# Patient Record
Sex: Male | Born: 1960 | Race: White | Hispanic: No | Marital: Married | Smoking: Former smoker
Health system: Southern US, Community
[De-identification: ages and names within clinical notes are randomized; demographics above are authoritative.]

## PROBLEM LIST (undated history)

## (undated) DIAGNOSIS — Z87442 Personal history of urinary calculi: Secondary | ICD-10-CM

## (undated) DIAGNOSIS — I10 Essential (primary) hypertension: Secondary | ICD-10-CM

## (undated) HISTORY — DX: Essential (primary) hypertension: I10

---

## 2006-08-13 HISTORY — PX: LUMBAR DISC SURGERY: SHX700

## 2007-03-05 ENCOUNTER — Ambulatory Visit (HOSPITAL_COMMUNITY): Admission: RE | Admit: 2007-03-05 | Discharge: 2007-03-05 | Payer: Self-pay | Admitting: Neurosurgery

## 2007-03-12 ENCOUNTER — Ambulatory Visit (HOSPITAL_COMMUNITY): Admission: RE | Admit: 2007-03-12 | Discharge: 2007-03-14 | Payer: Self-pay | Admitting: Neurosurgery

## 2010-08-24 ENCOUNTER — Emergency Department (HOSPITAL_COMMUNITY)
Admission: EM | Admit: 2010-08-24 | Discharge: 2010-08-24 | Payer: Self-pay | Source: Home / Self Care | Admitting: Emergency Medicine

## 2010-08-28 LAB — URINALYSIS, ROUTINE W REFLEX MICROSCOPIC
Bilirubin Urine: NEGATIVE
Ketones, ur: 15 mg/dL — AB
Leukocytes, UA: NEGATIVE
Nitrite: NEGATIVE
Protein, ur: NEGATIVE mg/dL
Specific Gravity, Urine: 1.017 (ref 1.005–1.030)
Urine Glucose, Fasting: NEGATIVE mg/dL
Urobilinogen, UA: 0.2 mg/dL (ref 0.0–1.0)
pH: 7 (ref 5.0–8.0)

## 2010-08-28 LAB — DIFFERENTIAL
Basophils Absolute: 0.1 10*3/uL (ref 0.0–0.1)
Basophils Relative: 0 % (ref 0–1)
Eosinophils Absolute: 0.1 10*3/uL (ref 0.0–0.7)
Eosinophils Relative: 0 % (ref 0–5)
Lymphocytes Relative: 8 % — ABNORMAL LOW (ref 12–46)
Lymphs Abs: 1.1 10*3/uL (ref 0.7–4.0)
Monocytes Absolute: 0.5 10*3/uL (ref 0.1–1.0)
Monocytes Relative: 4 % (ref 3–12)
Neutro Abs: 11.7 10*3/uL — ABNORMAL HIGH (ref 1.7–7.7)
Neutrophils Relative %: 87 % — ABNORMAL HIGH (ref 43–77)

## 2010-08-28 LAB — CBC
HCT: 50.4 % (ref 39.0–52.0)
Hemoglobin: 18.2 g/dL — ABNORMAL HIGH (ref 13.0–17.0)
MCH: 33 pg (ref 26.0–34.0)
MCHC: 36.1 g/dL — ABNORMAL HIGH (ref 30.0–36.0)
MCV: 91.5 fL (ref 78.0–100.0)
Platelets: 184 10*3/uL (ref 150–400)
RBC: 5.51 MIL/uL (ref 4.22–5.81)
RDW: 12.6 % (ref 11.5–15.5)
WBC: 13.4 10*3/uL — ABNORMAL HIGH (ref 4.0–10.5)

## 2010-08-28 LAB — URINE MICROSCOPIC-ADD ON

## 2010-12-26 NOTE — Op Note (Signed)
Tristan Gray, Tristan Gray               ACCOUNT NO.:  [id number]   MEDICAL RECORD NO.:  [id number]          PATIENT TYPE:  OIB   LOCATION:  5148                         FACILITY:  [facility]   PHYSICIAN:  Hewitt Shorts, M.D.DATE OF BIRTH:  [birth year]   DATE OF PROCEDURE:  03/12/2007  DATE OF DISCHARGE:                               OPERATIVE REPORT   PREOPERATIVE DIAGNOSIS:  Left L4-L5 lumbar disk herniation, lumbar  spondylosis, lumbar degenerative disk disease, and lumbar radiculopathy.   POSTOPERATIVE DIAGNOSIS:  Dynamic degenerative L4-L5 spondylolisthesis,  lumbar stenosis, lumbar spondylosis, and left L4 vertebral body  hemangioma.   PROCEDURE PERFORMED:  Left L4-L5 lumbar laminotomy and microdiskectomy  with microdissection.   SURGEON:  Hewitt Shorts, M.D.   ASSISTANT SURGEON:  Nelia Shi. Webb Silversmith, NP; and Cristi Loron, M.D.   ANESTHESIA:  General endotracheal.   INDICATIONS FOR PROCEDURE:  The patient is a 50 year old man who  presented with a left foot drop.  He was found by MRI scan and re-  confirmed by myelogram and post myelogram CT scan to have a massive L4-  L5 disk herniation that extended rostrally behind the body of L4 with  near complete block at the L4-L5 level.  The decision was made to  proceed with laminotomy and microdiskectomy.   DESCRIPTION OF PROCEDURE:  The patient was brought to the operating room  and placed under general endotracheal anesthesia.  The patient was  turned into the prone position.  The lumbar region was prepped with  Betadine and saline solution and draped in the sterile fashion.  The  midline was infiltrated with the local anesthetic with epinephrine.  An  x-ray was taken and the L4-L5 level identified.  A midline incision was  made over the L4-L5 level and carried down through the subcutaneous  tissue with bipolar cautery, and electrocautery was used to maintain  hemostasis.  Dissection was carried down to the lumbar fascia  which was  incised on the left side and the paraspinal muscles were dissected from  the spinous process and lamina in subperiosteal fashion.  Another x-ray  was taken and the L4-L5 inter laminar space was identified.   The microscope was draped and brought into the field to provide  additional magnification, illumination and visualization.  The remainder  of the decompression was performed using the microdissection and  microsurgical technique.  An inferior L4 and superior L5 laminotomy was  performed.  The L4 laminotomy was extended rostrally to expose the  region behind the L4 vertebral body.  The ligamentum flavum was  carefully removed.  The thecal sac itself was bulging due to the large  size of the disk herniation.  We were able to carefully work our way  around it and began to mobilize what was a very large disk fragment that  came out in several large fragments.  The thecal sac became somewhat  more relaxed.  We did note two small rents in the lateral wall of the  thecal sac above the L5 nerve root.  These were later sutured closed  with interrupted 6-0 Prolene suture.  In the end, we did not enter the  disk space.  The annulus was bulging indicating underlying degeneration,  but no actual rent in the annulus was found, and therefore we solely  removed the free fragment.   After suturing the two small rents in the dura, we established  hemostasis with the use of Gelfoam soaked in thrombin and bipolar  cautery.  Again examined the epidural space to ensure that good  decompression of the thecal sac and exiting nerve roots was achieved,  and then we injected to seal over the dural repair and proceed with  closure.   The deep fascia was closed with interrupted undyed #1 Vicryl sutures;  the Scarpa's fascia was closed with interrupted undyed 0 Vicryl sutures;  the subcutaneous and subcuticular layers were closed with interrupted  inverted 2-0 endo Vicryl sutures; and the skin was  closed with a running  locking 3-0 nylon suture.  The wound was dressed with Adaptic and  sterile gauze.  The procedure was tolerated well.  The estimated blood  loss was 100 mL.  Sponge counts were correct.  Following the procedure,  the patient was to be turned back into the supine position, to be  reversed from the anesthetic, extubated, and transferred to the recovery  room for further care.      Hewitt Shorts, M.D.  Electronically Signed     RWN/MEDQ  D:  03/12/2007  T:  03/13/2007  Job:  [id number]

## 2011-05-28 LAB — BASIC METABOLIC PANEL
BUN: 11
CO2: 28
Calcium: 9.9
Chloride: 105
Creatinine, Ser: 0.81
GFR calc Af Amer: >60
GFR calc non Af Amer: >60
Glucose, Bld: 91
Potassium: 3.9
Sodium: 142

## 2011-05-28 LAB — CBC
HCT: 50.2
Hemoglobin: 17.7 — ABNORMAL HIGH
MCHC: 35.3
MCV: 94.6
Platelets: 223
RBC: 5.3
RDW: 12.3
WBC: 9.6

## 2012-05-14 DIAGNOSIS — I10 Essential (primary) hypertension: Secondary | ICD-10-CM | POA: Diagnosis present

## 2017-01-14 DIAGNOSIS — F411 Generalized anxiety disorder: Secondary | ICD-10-CM | POA: Diagnosis present

## 2017-01-17 DIAGNOSIS — E559 Vitamin D deficiency, unspecified: Secondary | ICD-10-CM | POA: Insufficient documentation

## 2017-05-23 DIAGNOSIS — R748 Abnormal levels of other serum enzymes: Secondary | ICD-10-CM | POA: Diagnosis present

## 2019-12-14 ENCOUNTER — Other Ambulatory Visit: Payer: Self-pay | Admitting: Family Medicine

## 2019-12-14 DIAGNOSIS — M5116 Intervertebral disc disorders with radiculopathy, lumbar region: Secondary | ICD-10-CM

## 2020-01-01 ENCOUNTER — Other Ambulatory Visit: Payer: Self-pay | Admitting: Neurosurgery

## 2020-01-01 DIAGNOSIS — M4316 Spondylolisthesis, lumbar region: Secondary | ICD-10-CM

## 2020-01-04 ENCOUNTER — Encounter: Payer: Self-pay | Admitting: Physical Therapy

## 2020-01-04 ENCOUNTER — Ambulatory Visit
Admission: RE | Admit: 2020-01-04 | Discharge: 2020-01-04 | Disposition: A | Discharge status: Home / Self Care | Payer: 59 | Source: Ambulatory Visit | Attending: Neurosurgery | Admitting: Neurosurgery

## 2020-01-04 ENCOUNTER — Other Ambulatory Visit: Payer: Self-pay

## 2020-01-04 ENCOUNTER — Ambulatory Visit: Payer: No Typology Code available for payment source | Attending: Neurosurgery | Admitting: Physical Therapy

## 2020-01-04 DIAGNOSIS — M6281 Muscle weakness (generalized): Secondary | ICD-10-CM | POA: Diagnosis present

## 2020-01-04 DIAGNOSIS — M5441 Lumbago with sciatica, right side: Secondary | ICD-10-CM

## 2020-01-04 DIAGNOSIS — R262 Difficulty in walking, not elsewhere classified: Secondary | ICD-10-CM | POA: Diagnosis present

## 2020-01-04 DIAGNOSIS — M4316 Spondylolisthesis, lumbar region: Secondary | ICD-10-CM

## 2020-01-04 NOTE — Patient Instructions (Signed)
Access Code: [email or web address] URL: [email or web address] Date: 01/04/2020 Prepared by: Lysle Rubens  Exercises Supine Lower Trunk Rotation - 1 x daily - 7 x weekly - 3 sets - 10 reps - 5 sec hold Supine Posterior Pelvic Tilt - 1 x daily - 7 x weekly - 3 sets - 10 reps - 3 sec hold Supine Double Knee to Chest - 1 x daily - 7 x weekly - 3 sets - 10 reps - 5 sec hold

## 2020-01-04 NOTE — Therapy (Signed)
[facility] [address] [place], [place], [address] Phone: [phone]   Fax:  [phone]  Physical Therapy Evaluation  Patient Details  Name: Tristan Gray MRN: [id number] Date of Birth: [birth year] Referring Provider (PT): Hoyt Koch   Encounter Date: 01/04/2020  PT End of Session - 01/04/20 1744    Visit Number  1    Date for PT Re-Evaluation  03/05/20    PT Start Time  1700    PT Stop Time  1748    PT Time Calculation (min)  48 min    Activity Tolerance  Patient limited by pain    Behavior During Therapy  [facility] for tasks assessed/performed       Past Medical History:  Diagnosis Date  . Hypertension     Past Surgical History:  Procedure Laterality Date  . LUMBAR DISC SURGERY  2008    There were no vitals filed for this visit.   Subjective Assessment - 01/04/20 1704    Subjective  Pt reports L4-L5 sciatica on R side beginning on 12/04/2019. Pt states that he noticed that his back was a little sore before but woke up one morning and his back was really hurting plus radiating pain. Pt reports radiating pain down RLE similar to what he experienced prior to back surgery in 2008. Pt reports tingling on R toes and numbness on outside of RLE. Pt reports that course of steroids last week helped with inflammation. Pt reports negative for bowel and bladder changes.    Pertinent History  lumbar surgery 2008, HTN    How long can you sit comfortably?  <5 minutes; leans heavily to the L    How long can you stand comfortably?  <5 min    How long can you walk comfortably?  <5 min    Diagnostic tests  MRI    Patient Stated Goals  not be in pain    Currently in Pain?  Yes    Pain Score  7     Pain Location  Back    Pain Orientation  Right    Pain Descriptors / Indicators  Radiating;Burning;Aching    Pain Radiating Towards  R LE    Pain Onset  More than a month ago    Pain Frequency  Constant    Aggravating Factors    walking, bending, sitting, standing    Pain Relieving Factors  finding comfortable position, steroids, pain meds         [facility] PT Assessment - 01/04/20 0001      Assessment   Medical Diagnosis  LBP with radiating pain     Referring Provider (PT)  Hoyt Koch    Onset Date/Surgical Date  12/04/19    Prior Therapy  None      Precautions   Precautions  None      Restrictions   Weight Bearing Restrictions  No      Balance Screen   Has the patient fallen in the past 6 months  Yes    How many times?  1    Has the patient had a decrease in activity level because of a fear of falling?   No    Is the patient reluctant to leave their home because of a fear of falling?   No      Home Environment   Additional Comments  Trouble with stairs; 4 stairs to enter house and needs assistance from wife  Prior Function   Level of Independence  Independent      Sensation   Light Touch  Impaired by gross assessment    Additional Comments  diminished sensation along lateral aspect of lower RLE      Functional Tests   Functional tests  Sit to Stand      Sit to Stand   Comments  leaning excessively on LLE      Posture/Postural Control   Posture/Postural Control  Postural limitations    Postural Limitations  Flexed trunk;Weight shift left      ROM / Strength   AROM / PROM / Strength  Strength;AROM      AROM   Overall AROM Comments  Lumbar AROM severely limited in all directions d/t pain      Strength   Strength Assessment Site  Hip;Knee;Ankle    Right/Left Hip  Right;Left    Right Hip Flexion  5/5    Right Hip Extension  3/5    Right Hip ABduction  3-/5    Left Hip Flexion  5/5    Left Hip Extension  4/5    Left Hip ABduction  4+/5    Right/Left Knee  Right;Left    Right Knee Flexion  4/5    Right Knee Extension  4/5    Left Knee Flexion  5/5    Left Knee Extension  5/5    Right/Left Ankle  Right;Left    Right Ankle Dorsiflexion  2-/5    Left Ankle Dorsiflexion  5/5       Flexibility   Soft Tissue Assessment /Muscle Length  yes    Hamstrings  unable to test secondary to pain    Piriformis  unable to test secondary to pain      Palpation   Palpation comment  tender to palpation R lumbar paraspinals, R TFL, R glute/piriformis      Special Tests    Special Tests  Lumbar    Lumbar Tests  Straight Leg Raise      Straight Leg Raise   Findings  Positive    Side   Right      Transfers   Comments  very painful transferring from supine<>sit      Ambulation/Gait   Ambulation/Gait  Yes    Ambulation/Gait Assistance  6: Modified independent (Device/Increase time)    Ambulation Distance (Feet)  25 Feet    Assistive device  Straight cane    Gait Pattern  Decreased arm swing - right;Decreased step length - left;Decreased stance time - right;Decreased stride length;Decreased hip/knee flexion - right;Decreased dorsiflexion - right;Decreased weight shift to right;Antalgic;Poor foot clearance - right    Ambulation Surface  Level    Gait velocity  diminished    Gait Comments  noticable foot drop on R                  Objective measurements completed on examination: See above findings.      [facility] Adult PT Treatment/Exercise - 01/04/20 0001      Exercises   Exercises  Lumbar      Lumbar Exercises: Stretches   Double Knee to Chest Stretch  5 reps;10 seconds    Lower Trunk Rotation  60 seconds    Pelvic Tilt  10 reps;5 seconds    Pelvic Tilt Limitations  supine      Modalities   Modalities  Electrical Stimulation;Moist Heat      Moist Heat Therapy   Number Minutes Moist Heat  12  Minutes    Moist Heat Location  Lumbar Spine      Electrical Stimulation   Electrical Stimulation Location  Lumbar    Electrical Stimulation Action  IFC    Electrical Stimulation Parameters  hooklying    Electrical Stimulation Goals  Pain             PT Education - 01/04/20 1743    Education Details  Pt educated on condition, rehab process, and HEP     Person(s) Educated  Patient    Methods  Explanation;Demonstration;Handout    Comprehension  Verbalized understanding;Returned demonstration       PT Short Term Goals - 01/04/20 1759      PT SHORT TERM GOAL #1   Title  Pt will be independent with HEP    Time  2    Period  Weeks    Status  New    Target Date  01/18/20      PT SHORT TERM GOAL #2   Title  Pt will demonstrate R DF MMT of 3+/5    Baseline  2-/5    Time  3    Period  Weeks    Status  New    Target Date  01/25/20        PT Long Term Goals - 01/04/20 1759      PT LONG TERM GOAL #1   Title  Pt will demonstrate lumbar AROM WFL without reports of increase in LBP/radiating pain    Time  8    Period  Weeks    Status  New    Target Date  02/29/20      PT LONG TERM GOAL #2   Title  Pt will report ability to sit >30 minutes without increase in LBP or radiating pain    Time  8    Period  Weeks    Status  New    Target Date  02/29/20      PT LONG TERM GOAL #3   Title  Pt will report resolution of radiating pain in RLE    Time  8    Period  Weeks    Status  New    Target Date  02/29/20      PT LONG TERM GOAL #4   Title  Pt will demonstrate RLE MMT equivalent to LLE    Time  8    Period  Weeks    Target Date  02/29/20      PT LONG TERM GOAL #5   Title  Pt will report reduction in pain by 50%    Time  8    Period  Weeks    Status  New    Target Date  02/29/20             Plan - 01/04/20 1744    Clinical Impression Statement  Pt presents to clinic with 1 month history of acute onset of R LBP with radiating pain into RLE. Pt has history of L4-L5 laminectomy/discetomy in 2008; pt reports pain is similar to pain he experienced prior to surgery. In clinic, pt demonstrates significant foot drop on RLE with significant deficits in R DF strength. Pt demonstrates diminished sensation along lateral aspect of RLE, diminished strength on R, positive SLR on R, lumbar ROM deficits secondary to pain, and pain  with all transitional movements. Pt ambulated with SPC; demonstrates antalgic gait with excessive leaning to LLE. Pt has been unable to work or perform ADLs d/t pain. Pt  would benefit from skilled PT to address the above impairments.    Personal Factors and Comorbidities  Comorbidity 1;Past/Current Experience    Comorbidities  HTN    Examination-Participation Restrictions  Cleaning;Community Activity;Driving;Interpersonal Relationship;Dorita Sciara    Stability/Clinical Decision Making  Evolving/Moderate complexity    Clinical Decision Making  Moderate    Rehab Potential  Good    PT Frequency  2x / week    PT Duration  8 weeks    PT Treatment/Interventions  ADLs/Self Care Home Management;Cryotherapy;Electrical Stimulation;Ultrasound;Traction;Moist Heat;Iontophoresis 4mg /ml Dexamethasone;Gait training;Stair training;Functional mobility training;Therapeutic activities;Therapeutic exercise;Balance training;Neuromuscular re-education;Manual techniques;Patient/family education;Passive range of motion;Dry needling;Taping    PT Next Visit Plan  Review HEP, get pt moving, pain reduction, lumbar/LE strengthening/flexibility    PT Home Exercise Plan  lower trunk rotations, double knee to chest, PPT in hooklying    Consulted and Agree with Plan of Care  Patient       Patient will benefit from skilled therapeutic intervention in order to improve the following deficits and impairments:  Abnormal gait, Decreased range of motion, Difficulty walking, Decreased endurance, Increased muscle spasms, Decreased activity tolerance, Pain, Decreased balance, Hypomobility, Impaired flexibility, Improper body mechanics, Decreased mobility, Decreased strength, Impaired sensation, Postural dysfunction  Visit Diagnosis: Muscle weakness (generalized)  Acute right-sided low back pain with right-sided sciatica  Difficulty in walking, not elsewhere classified     Problem List There are no problems to display for this  patient.  Amador Cunas, PT, DPT Donald Prose Caleigha Zale 01/04/2020, 6:06 PM  [place] [address] [place], [place], [address] Phone: [phone]   Fax:  [phone]  Name: JIOVANNY BURDELL MRN: [id number] Date of Birth: [birth year]

## 2020-01-07 ENCOUNTER — Other Ambulatory Visit: Payer: Self-pay

## 2020-01-07 ENCOUNTER — Ambulatory Visit: Payer: No Typology Code available for payment source | Admitting: Physical Therapy

## 2020-01-07 DIAGNOSIS — M5441 Lumbago with sciatica, right side: Secondary | ICD-10-CM

## 2020-01-07 DIAGNOSIS — M6281 Muscle weakness (generalized): Secondary | ICD-10-CM | POA: Diagnosis not present

## 2020-01-07 DIAGNOSIS — R262 Difficulty in walking, not elsewhere classified: Secondary | ICD-10-CM

## 2020-01-07 NOTE — Therapy (Signed)
[facility] [address] [place], [place], [address] Phone: [phone]   Fax:  [phone]  Physical Therapy Treatment  Patient Details  Name: Tristan Gray MRN: [id number] Date of Birth: 05/18/1961 Referring Provider (PT): Duffy Rhody   Encounter Date: 01/07/2020  PT End of Session - 01/07/20 1707    Visit Number  2    Date for PT Re-Evaluation  03/05/20    PT Start Time  1615    PT Stop Time  1703    PT Time Calculation (min)  48 min       Past Medical History:  Diagnosis Date  . Hypertension     Past Surgical History:  Procedure Laterality Date  . [place] SURGERY  2008    There were no vitals filed for this visit.  Subjective Assessment - 01/07/20 1619    Subjective  " some better, I think preniside helped." pt amb with SPC decreased wt on RT LE and with sitting shifts Left.    Pain Score  7     Pain Location  Back    Pain Orientation  Right                        [facility] Adult PT Treatment/Exercise - 01/07/20 0001      Lumbar Exercises: Supine   Ab Set  15 reps;3 seconds   with ball   Clam  15 reps;3 seconds   tband   Bent Knee Raise  10 reps;3 seconds   isometric into ball   Bridge with Ball Squeeze  15 reps;3 seconds    Other Supine Lumbar Exercises  bridge, KTC and obl with feet on ball 10 reps    Other Supine Lumbar Exercises  DF 10x with reisstance, added tabnd but needed cues as he kicked in additional muscles      Modalities   Modalities  Traction      Traction   Type of Traction  Lumbar    Max (lbs)  50    Time  15      Manual Therapy   Manual Therapy  Passive ROM    Manual therapy comments  strtech and pain but pt felt helpfull    Passive ROM  RT LE HS with calf adn neural flossing             PT Education - 01/07/20 1649    Education Details  pt able ot verb doing HEP. pt stated he has inversion table but fearful to try- okayed to use but  limited ROM    Person(s) Educated  Patient    Methods  Explanation;Demonstration    Comprehension  Verbalized understanding       PT Short Term Goals - 01/07/20 1650      PT SHORT TERM GOAL #1   Title  Pt will be independent with HEP    Status  Partially Met      PT SHORT TERM GOAL #2   Title  Pt will demonstrate R DF MMT of 3+/5    Status  On-going        PT Long Term Goals - 01/04/20 1759      PT LONG TERM GOAL #1   Title  Pt will demonstrate lumbar AROM WFL without reports of increase in LBP/radiating pain    Time  8    Period  Weeks    Status  New  Target Date  02/29/20      PT LONG TERM GOAL #2   Title  Pt will report ability to sit >30 minutes without increase in LBP or radiating pain    Time  8    Period  Weeks    Status  New    Target Date  02/29/20      PT LONG TERM GOAL #3   Title  Pt will report resolution of radiating pain in RLE    Time  8    Period  Weeks    Status  New    Target Date  02/29/20      PT LONG TERM GOAL #4   Title  Pt will demonstrate RLE MMT equivalent to LLE    Time  8    Period  Weeks    Target Date  02/29/20      PT LONG TERM GOAL #5   Title  Pt will report reduction in pain by 50%    Time  8    Period  Weeks    Status  New    Target Date  02/29/20            Plan - 01/07/20 1650    Clinical Impression Statement  pt tolerated initial progression of core stab well but did need cuing to decrease compensation. pt very tight in RT HS esp with added calf stretch and nerve flossing- but felt it helped, showed pt how to do at home with strap. mech lumb traction pt verb felt okay no increase in symptoms and up taller after- okayed inversion table gentle at home.DF is still limited.    PT Treatment/Interventions  ADLs/Self Care Home Management;Cryotherapy;Electrical Stimulation;Ultrasound;Traction;Moist Heat;Iontophoresis 63m/ml Dexamethasone;Gait training;Stair training;Functional mobility training;Therapeutic  activities;Therapeutic exercise;Balance training;Neuromuscular re-education;Manual techniques;Patient/family education;Passive range of motion;Dry needling;Taping    PT Next Visit Plan  assess and progress       Patient will benefit from skilled therapeutic intervention in order to improve the following deficits and impairments:  Abnormal gait, Decreased range of motion, Difficulty walking, Decreased endurance, Increased muscle spasms, Decreased activity tolerance, Pain, Decreased balance, Hypomobility, Impaired flexibility, Improper body mechanics, Decreased mobility, Decreased strength, Impaired sensation, Postural dysfunction  Visit Diagnosis: Muscle weakness (generalized)  Acute right-sided low back pain with right-sided sciatica  Difficulty in walking, not elsewhere classified     Problem List There are no problems to display for this patient.   Tristan Gray,ANGIE PTA 01/07/2020, 5:08 PM  [address] [address]Phone: [phone]  Fax:  [phone] Name: Tristan ROSOLMRN: [id number] of Birth: 804/11/1960

## 2020-01-13 ENCOUNTER — Ambulatory Visit: Payer: No Typology Code available for payment source | Attending: Neurosurgery | Admitting: Physical Therapy

## 2020-01-13 ENCOUNTER — Other Ambulatory Visit: Payer: Self-pay

## 2020-01-13 ENCOUNTER — Encounter: Payer: Self-pay | Admitting: Physical Therapy

## 2020-01-13 DIAGNOSIS — M6281 Muscle weakness (generalized): Secondary | ICD-10-CM | POA: Diagnosis present

## 2020-01-13 DIAGNOSIS — M5441 Lumbago with sciatica, right side: Secondary | ICD-10-CM | POA: Insufficient documentation

## 2020-01-13 DIAGNOSIS — R262 Difficulty in walking, not elsewhere classified: Secondary | ICD-10-CM | POA: Diagnosis present

## 2020-01-13 NOTE — Therapy (Signed)
[facility] [address] [place], [place], [address] Phone: [phone]   Fax:  [phone]  Physical Therapy Treatment  Patient Details  Name: Tristan Gray MRN: [id number] Date of Birth: 08-08-1961 Referring Provider (PT): Hoyt Koch   Encounter Date: 01/13/2020  PT End of Session - 01/13/20 1415    Visit Number  3    Date for PT Re-Evaluation  03/05/20    PT Start Time  1345    PT Stop Time  1428    PT Time Calculation (min)  43 min    Activity Tolerance  Patient limited by pain    Behavior During Therapy  [facility] for tasks assessed/performed       Past Medical History:  Diagnosis Date  . Hypertension     Past Surgical History:  Procedure Laterality Date  . LUMBAR DISC SURGERY  2008    There were no vitals filed for this visit.  Subjective Assessment - 01/13/20 1349    Subjective  "More mobility less pain since last session"    Currently in Pain?  Yes    Pain Score  6     Pain Location  Back                        [facility] Adult PT Treatment/Exercise - 01/13/20 0001      Lumbar Exercises: Aerobic   Nustep  L3 x 3 min      Lumbar Exercises: Supine   Ab Set  15 reps;3 seconds   with ball    Clam  15 reps;3 seconds    Bridge  Compliant;15 reps    Bridge with [employer]  15 reps;3 seconds    Other Supine Lumbar Exercises  bridge, KTC and obl with feet on ball 10 reps      Traction   Type of Traction  Lumbar    Min (lbs)  55    Max (lbs)  65    Hold Time  60    Rest Time  20    Time  12      Manual Therapy   Passive ROM  RT LE HS with calf                PT Short Term Goals - 01/13/20 1415      PT SHORT TERM GOAL #1   Title  Pt will be independent with HEP    Status  Achieved        PT Long Term Goals - 01/04/20 1759      PT LONG TERM GOAL #1   Title  Pt will demonstrate lumbar AROM WFL without reports of increase in LBP/radiating pain    Time  8    Period  Weeks    Status  New    Target Date  02/29/20      PT LONG TERM GOAL #2   Title  Pt will report ability to sit >30 minutes without increase in LBP or radiating pain    Time  8    Period  Weeks    Status  New    Target Date  02/29/20      PT LONG TERM GOAL #3   Title  Pt will report resolution of radiating pain in RLE    Time  8    Period  Weeks    Status  New    Target Date  02/29/20  PT LONG TERM GOAL #4   Title  Pt will demonstrate RLE MMT equivalent to LLE    Time  8    Period  Weeks    Target Date  02/29/20      PT LONG TERM GOAL #5   Title  Pt will report reduction in pain by 50%    Time  8    Period  Weeks    Status  New    Target Date  02/29/20            Plan - 01/13/20 1416    Clinical Impression Statement  Pt limited by pain today despite reporting improvement overall. Un able to tolerated sitting or standing interventions Initial pain in R leg with supine bridges while legs on [place] but was able to perform this after some K2C reps. Constant tactile and verbal cues needed to maintain core engagement with the supine interventions. Pt reported that he would like more pull with mechanical traction.    Personal Factors and Comorbidities  Comorbidity 1;Past/Current Experience    Comorbidities  HTN    Examination-Participation Restrictions  Cleaning;Community Activity;Driving;Interpersonal Relationship;Dorita Sciara    Stability/Clinical Decision Making  Evolving/Moderate complexity    Rehab Potential  Good    PT Frequency  2x / week    PT Duration  8 weeks    PT Treatment/Interventions  ADLs/Self Care Home Management;Cryotherapy;Electrical Stimulation;Ultrasound;Traction;Moist Heat;Iontophoresis 4mg /ml Dexamethasone;Gait training;Stair training;Functional mobility training;Therapeutic activities;Therapeutic exercise;Balance training;Neuromuscular re-education;Manual techniques;Patient/family education;Passive range of motion;Dry needling;Taping     PT Next Visit Plan  assess and progress       Patient will benefit from skilled therapeutic intervention in order to improve the following deficits and impairments:  Abnormal gait, Decreased range of motion, Difficulty walking, Decreased endurance, Increased muscle spasms, Decreased activity tolerance, Pain, Decreased balance, Hypomobility, Impaired flexibility, Improper body mechanics, Decreased mobility, Decreased strength, Impaired sensation, Postural dysfunction  Visit Diagnosis: Acute right-sided low back pain with right-sided sciatica  Difficulty in walking, not elsewhere classified  Muscle weakness (generalized)     Problem List There are no problems to display for this patient.   Scot Jun, PTA 01/13/2020, 2:23 PM  [address] [place], [place], [address] Phone: [phone]   Fax:  [phone]  Name: ELIZAR ALPERN MRN: [id number] Date of Birth: 01-05-1961

## 2020-01-14 ENCOUNTER — Ambulatory Visit: Payer: No Typology Code available for payment source | Admitting: Physical Therapy

## 2020-01-14 DIAGNOSIS — M5441 Lumbago with sciatica, right side: Secondary | ICD-10-CM

## 2020-01-14 DIAGNOSIS — M6281 Muscle weakness (generalized): Secondary | ICD-10-CM

## 2020-01-14 DIAGNOSIS — R262 Difficulty in walking, not elsewhere classified: Secondary | ICD-10-CM

## 2020-01-14 NOTE — Therapy (Signed)
[place] [address] [place], [place], [address] Phone: [phone]   Fax:  [phone]  Physical Therapy Treatment  Patient Details  Name: Tristan Gray MRN: [id number] Date of Birth: [birth year] Referring Provider (PT): Tristan Gray   Encounter Date: 01/14/2020  PT End of Session - 01/14/20 1643    Visit Number  4    Date for PT Re-Evaluation  03/05/20    PT Start Time  1610    PT Stop Time  1700    PT Time Calculation (min)  50 min       Past Medical History:  Diagnosis Date  . Hypertension     Past Surgical History:  Procedure Laterality Date  . [place] SURGERY  2008    There were no vitals filed for this visit.  Subjective Assessment - 01/14/20 1621    Subjective  amb in without AD, antalgic with RT foot drop but reports improvemnet. pt verb traction helps afraid to use inversion d/t leg and table locks at ankles    Currently in Pain?  Yes    Pain Score  6     Pain Location  Back    Pain Orientation  Right                        [facility] Adult PT Treatment/Exercise - 01/14/20 0001      Lumbar Exercises: Aerobic   Elliptical  2 min fwd/2 backward    UBE (Upper Arm Bike)  L 3 2 min fwd/ 2 back   standing with multi rest breaks.     Lumbar Exercises: Standing   Row  Strengthening;Both;15 reps;Theraband    Theraband Level (Row)  Level 2 (Red)    Shoulder Extension  Strengthening;Both;15 reps;Theraband    Theraband Level (Shoulder Extension)  Level 2 (Red)    Other Standing Lumbar Exercises  hip ext and abd BIL 10 each      Lumbar Exercises: Seated   Long Arc Quad on Chair  Strengthening;Both;10 reps   on sit fit   Sit to Stand  10 reps   wt ball    Other Seated Lumbar Exercises  sit fit pelvic ROM 10 each way      Traction   Type of Traction  Lumbar    Min (lbs)  60    Max (lbs)  70    Hold Time  60    Rest Time  20    Time  15      Manual Therapy   Passive  ROM  RT LE HS with calf                PT Short Term Goals - 01/14/20 1643      PT SHORT TERM GOAL #2   Title  Pt will demonstrate R DF MMT of 3+/5    Status  On-going        PT Long Term Goals - 01/04/20 1759      PT LONG TERM GOAL #1   Title  Pt will demonstrate lumbar AROM WFL without reports of increase in LBP/radiating pain    Time  8    Period  Weeks    Status  New    Target Date  02/29/20      PT LONG TERM GOAL #2   Title  Pt will report ability to sit >30 minutes without increase in LBP or radiating pain  Time  8    Period  Weeks    Status  New    Target Date  02/29/20      PT LONG TERM GOAL #3   Title  Pt will report resolution of radiating pain in RLE    Time  8    Period  Weeks    Status  New    Target Date  02/29/20      PT LONG TERM GOAL #4   Title  Pt will demonstrate RLE MMT equivalent to LLE    Time  8    Period  Weeks    Target Date  02/29/20      PT LONG TERM GOAL #5   Title  Pt will report reduction in pain by 50%    Time  8    Period  Weeks    Status  New    Target Date  02/29/20            Plan - 01/14/20 1644    Clinical Impression Statement  better flexibilty in RT LE. increased ex today but needed freq seated rest with standing ex d/t increased radiating symptoms. cuing for ex to stab and control mvmt. pt continues to have RT foot drop but is now amb without AD. pt is complaint with ex at home and cautioned to not overdo and he VU. pt states relief with traction and asked to increase.    PT Treatment/Interventions  ADLs/Self Care Home Management;Cryotherapy;Electrical Stimulation;Ultrasound;Traction;Moist Heat;Iontophoresis 4mg /ml Dexamethasone;Gait training;Stair training;Functional mobility training;Therapeutic activities;Therapeutic exercise;Balance training;Neuromuscular re-education;Manual techniques;Patient/family education;Passive range of motion;Dry needling;Taping    PT Next Visit Plan  assess and progress        Patient will benefit from skilled therapeutic intervention in order to improve the following deficits and impairments:  Abnormal gait, Decreased range of motion, Difficulty walking, Decreased endurance, Increased muscle spasms, Decreased activity tolerance, Pain, Decreased balance, Hypomobility, Impaired flexibility, Improper body mechanics, Decreased mobility, Decreased strength, Impaired sensation, Postural dysfunction  Visit Diagnosis: Difficulty in walking, not elsewhere classified  Acute right-sided low back pain with right-sided sciatica  Muscle weakness (generalized)     Problem List There are no problems to display for this patient.   Tristan Gray,Tristan Gray 01/14/2020, 4:47 PM  [facility] [address] [place], [place], [place] Phone: [phone]   Fax:  [phone]  Name: Tristan Gray MRN: Tristan Gray Date of Birth: 10/15/1960

## 2020-01-19 ENCOUNTER — Encounter: Payer: Self-pay | Admitting: Physical Therapy

## 2020-01-19 ENCOUNTER — Ambulatory Visit: Payer: No Typology Code available for payment source | Admitting: Physical Therapy

## 2020-01-19 ENCOUNTER — Other Ambulatory Visit: Payer: Self-pay

## 2020-01-19 DIAGNOSIS — M5441 Lumbago with sciatica, right side: Secondary | ICD-10-CM | POA: Diagnosis not present

## 2020-01-19 DIAGNOSIS — M6281 Muscle weakness (generalized): Secondary | ICD-10-CM

## 2020-01-19 DIAGNOSIS — R262 Difficulty in walking, not elsewhere classified: Secondary | ICD-10-CM

## 2020-01-19 NOTE — Therapy (Signed)
[facility] [address] [place], [place], [address] Phone: [phone]   Fax:  [phone]  Physical Therapy Treatment  Patient Details  Name: Tristan Gray MRN: [id number] Date of Birth: 11-Apr-1961 Referring Provider (PT): Hoyt Koch   Encounter Date: 01/19/2020  PT End of Session - 01/19/20 1441    Visit Number  5    Date for PT Re-Evaluation  03/05/20    PT Start Time  1400    PT Stop Time  1450    PT Time Calculation (min)  50 min    Activity Tolerance  Patient tolerated treatment well    Behavior During Therapy  [facility] for tasks assessed/performed       Past Medical History:  Diagnosis Date  . Hypertension     Past Surgical History:  Procedure Laterality Date  . LUMBAR DISC SURGERY  2008    There were no vitals filed for this visit.  Subjective Assessment - 01/19/20 1407    Subjective  Pt reports that he feels he is getting better; says traction is helping. States he is waiting for a call about his CT scan results.    Currently in Pain?  Yes    Pain Score  5     Pain Location  Back    Pain Orientation  Right                        [facility] Adult PT Treatment/Exercise - 01/19/20 0001      Lumbar Exercises: Stretches   Active Hamstring Stretch  Right;1 rep;60 seconds    Piriformis Stretch  Right;1 rep;60 seconds      Lumbar Exercises: Aerobic   Nustep  L5 x 7 min LE only      Lumbar Exercises: Machines for Strengthening   Other Lumbar Machine Exercise  lats and rows 45# 2x15      Lumbar Exercises: Standing   Other Standing Lumbar Exercises  hip ext and abd BIL 10 each    Other Standing Lumbar Exercises  sit to stand with weighted ball press and overhead raise 2x10      Lumbar Exercises: Seated   Long Arc Quad on Chair  Strengthening;Both;10 reps    Other Seated Lumbar Exercises  seated medicine ball twist x10 B      Traction   Type of Traction  Lumbar    Min (lbs)  60     Max (lbs)  70    Hold Time  60    Rest Time  20    Time  15               PT Short Term Goals - 01/14/20 1643      PT SHORT TERM GOAL #2   Title  Pt will demonstrate R DF MMT of 3+/5    Status  On-going        PT Long Term Goals - 01/04/20 1759      PT LONG TERM GOAL #1   Title  Pt will demonstrate lumbar AROM WFL without reports of increase in LBP/radiating pain    Time  8    Period  Weeks    Status  New    Target Date  02/29/20      PT LONG TERM GOAL #2   Title  Pt will report ability to sit >30 minutes without increase in LBP or radiating pain    Time  8  Period  Weeks    Status  New    Target Date  02/29/20      PT LONG TERM GOAL #3   Title  Pt will report resolution of radiating pain in RLE    Time  8    Period  Weeks    Status  New    Target Date  02/29/20      PT LONG TERM GOAL #4   Title  Pt will demonstrate RLE MMT equivalent to LLE    Time  8    Period  Weeks    Target Date  02/29/20      PT LONG TERM GOAL #5   Title  Pt will report reduction in pain by 50%    Time  8    Period  Weeks    Status  New    Target Date  02/29/20            Plan - 01/19/20 1441    Clinical Impression Statement  Pt tolerated progression of TE. Pt continues to have R foot drop but is able to better tolerate standing ex's this rx. Some complaints of radiating pain with lumbar extension; resolved with seated lumbar flexion stretches. Pt reports relief with traction.    PT Treatment/Interventions  ADLs/Self Care Home Management;Cryotherapy;Electrical Stimulation;Ultrasound;Traction;Moist Heat;Iontophoresis 4mg /ml Dexamethasone;Gait training;Stair training;Functional mobility training;Therapeutic activities;Therapeutic exercise;Balance training;Neuromuscular re-education;Manual techniques;Patient/family education;Passive range of motion;Dry needling;Taping    PT Next Visit Plan  assess and progress    Consulted and Agree with Plan of Care  Patient        Patient will benefit from skilled therapeutic intervention in order to improve the following deficits and impairments:  Abnormal gait, Decreased range of motion, Difficulty walking, Decreased endurance, Increased muscle spasms, Decreased activity tolerance, Pain, Decreased balance, Hypomobility, Impaired flexibility, Improper body mechanics, Decreased mobility, Decreased strength, Impaired sensation, Postural dysfunction  Visit Diagnosis: Difficulty in walking, not elsewhere classified  Acute right-sided low back pain with right-sided sciatica  Muscle weakness (generalized)     Problem List There are no problems to display for this patient.  Amador Cunas, PT, DPT Donald Prose Edgar Corrigan 01/19/2020, 2:43 PM  [place] [address] [place], [place], [address] Phone: [phone]   Fax:  [phone]  Name: Tristan Gray MRN: [id number] Date of Birth: 1961/02/26

## 2020-01-20 ENCOUNTER — Other Ambulatory Visit: Payer: 59

## 2020-01-21 ENCOUNTER — Ambulatory Visit: Payer: No Typology Code available for payment source | Admitting: Physical Therapy

## 2020-01-21 ENCOUNTER — Other Ambulatory Visit: Payer: Self-pay

## 2020-01-21 DIAGNOSIS — M5441 Lumbago with sciatica, right side: Secondary | ICD-10-CM

## 2020-01-21 DIAGNOSIS — M6281 Muscle weakness (generalized): Secondary | ICD-10-CM

## 2020-01-21 DIAGNOSIS — R262 Difficulty in walking, not elsewhere classified: Secondary | ICD-10-CM

## 2020-01-21 NOTE — Therapy (Signed)
[address] [place], [place], [address] Phone: [phone]   Fax:  [phone]  Physical Therapy Treatment  Patient Details  Name: Tristan Gray MRN: [id number] Date of Birth: 07/19/1961 Referring Provider (PT): Duffy Rhody   Encounter Date: 01/21/2020   PT End of Session - 01/21/20 1554    Visit Number 6    Date for PT Re-Evaluation 03/05/20    PT Start Time 1525    PT Stop Time 1615    PT Time Calculation (min) 50 min           Past Medical History:  Diagnosis Date  . Hypertension     Past Surgical History:  Procedure Laterality Date  . [place] SURGERY  2008    There were no vitals filed for this visit.   Subjective Assessment - 01/21/20 1526    Subjective doing better, everything is helping    Currently in Pain? Yes    Pain Score 4     Pain Location Back                             [facility] Adult PT Treatment/Exercise - 01/21/20 0001      Lumbar Exercises: Aerobic   Recumbent Bike 6 min L 2      Lumbar Exercises: Standing   Other Standing Lumbar Exercises resisitred gait 40# fwd/back /SW    Other Standing Lumbar Exercises cable ext and row      Lumbar Exercises: Supine   Other Supine Lumbar Exercises wt ball core stab      Lumbar Exercises: Quadruped   Opposite Arm/Leg Raise 20 reps;Left arm/Right leg;Right arm/Left leg      Traction   Type of Traction Lumbar    Min (lbs) 60    Max (lbs) 70    Hold Time 60    Rest Time 20    Time 15      Manual Therapy   Manual Therapy Passive ROM    Passive ROM RT LE HS with calf                     PT Short Term Goals - 01/21/20 1554      PT SHORT TERM GOAL #2   Title Pt will demonstrate R DF MMT of 3+/5    Baseline 3/5    Status Partially Met             PT Long Term Goals - 01/21/20 1555      PT LONG TERM GOAL #1   Title Pt will demonstrate lumbar AROM WFL without reports of  increase in LBP/radiating pain    Status On-going      PT LONG TERM GOAL #2   Title Pt will report ability to sit >30 minutes without increase in LBP or radiating pain    Status On-going      PT LONG TERM GOAL #3   Title Pt will report resolution of radiating pain in RLE    Status On-going      PT LONG TERM GOAL #4   Title Pt will demonstrate RLE MMT equivalent to LLE    Status On-going      PT LONG TERM GOAL #5   Title Pt will report reduction in pain by 50%    Status Achieved  Plan - 01/21/20 1555    Clinical Impression Statement progressing with LTGS. pain less, func better and improved gait but still impaired with RT LE weakness and foot drop.pt tolerated all ther ex without rest but some paina nd wekaness with cuing needed for core activation. increased PROM and continued relief with traction    PT Treatment/Interventions ADLs/Self Care Home Management;Cryotherapy;Electrical Stimulation;Ultrasound;Traction;Moist Heat;Iontophoresis 50m/ml Dexamethasone;Gait training;Stair training;Functional mobility training;Therapeutic activities;Therapeutic exercise;Balance training;Neuromuscular re-education;Manual techniques;Patient/family education;Passive range of motion;Dry needling;Taping    PT Next Visit Plan progress           Patient will benefit from skilled therapeutic intervention in order to improve the following deficits and impairments:  Abnormal gait, Decreased range of motion, Difficulty walking, Decreased endurance, Increased muscle spasms, Decreased activity tolerance, Pain, Decreased balance, Hypomobility, Impaired flexibility, Improper body mechanics, Decreased mobility, Decreased strength, Impaired sensation, Postural dysfunction  Visit Diagnosis: Difficulty in walking, not elsewhere classified  Acute right-sided low back pain with right-sided sciatica  Muscle weakness (generalized)     Problem List There are no problems to display for this  patient.   Kaylem Gidney,ANGIE PTA 01/21/2020, 3:58 PM  [address] [address]Phone: [phone]  Fax:  [phone] Name: SFAUSTINO LUECKEMRN: [id number] of Birth: 8December 22, 1962

## 2020-01-26 ENCOUNTER — Ambulatory Visit: Payer: No Typology Code available for payment source | Admitting: Physical Therapy

## 2020-01-28 ENCOUNTER — Ambulatory Visit: Payer: No Typology Code available for payment source | Admitting: Physical Therapy

## 2020-01-28 ENCOUNTER — Encounter: Payer: Self-pay | Admitting: Physical Therapy

## 2020-01-28 ENCOUNTER — Other Ambulatory Visit: Payer: Self-pay

## 2020-01-28 DIAGNOSIS — M6281 Muscle weakness (generalized): Secondary | ICD-10-CM

## 2020-01-28 DIAGNOSIS — R262 Difficulty in walking, not elsewhere classified: Secondary | ICD-10-CM

## 2020-01-28 DIAGNOSIS — M5441 Lumbago with sciatica, right side: Secondary | ICD-10-CM

## 2020-01-28 NOTE — Therapy (Signed)
[place] [address] [address] [place], [place], [address] Phone: [phone]   Fax:  [phone]  Physical Therapy Treatment  Patient Details  Name: Tristan Gray MRN: [id number] Date of Birth: July 01, 1961 Referring Provider (PT): Duffy Rhody   Encounter Date: 01/28/2020   PT End of Session - 01/28/20 1655    Visit Number 7    Date for PT Re-Evaluation 03/05/20    PT Start Time 1615    PT Stop Time 1710    PT Time Calculation (min) 55 min    Activity Tolerance Patient tolerated treatment well    Behavior During Therapy [facility] for tasks assessed/performed           Past Medical History:  Diagnosis Date  . Hypertension     Past Surgical History:  Procedure Laterality Date  . [place] SURGERY  2008    There were no vitals filed for this visit.   Subjective Assessment - 01/28/20 1624    Subjective Pt reports he had massage this weekend and is feeling much better    Currently in Pain? Yes    Pain Score 3     Pain Location Back    Pain Orientation Right                             [facility] Adult PT Treatment/Exercise - 01/28/20 0001      Lumbar Exercises: Aerobic   UBE (Upper Arm Bike) constant work 5 min      Lumbar Exercises: Machines for Strengthening   Cybex Lumbar Extension complained of pinching sensation so d/c this rx    Leg Press 40# BLE 2x10, 20# RLE 1x10    Other Lumbar Machine Exercise lats and rows 45# 2x15      Lumbar Exercises: Standing   Other Standing Lumbar Exercises standing ext over exercise ball x10 with 5 sec hold; heel raises BLE x15 SLS x15      Lumbar Exercises: Seated   Other Seated Lumbar Exercises seated lumbar flexion fwd/lat x2 each direction with 5 sec hold      Traction   Type of Traction Lumbar    Min (lbs) 60    Max (lbs) 70    Hold Time 60    Rest Time 20    Time 15                    PT Short Term Goals - 01/21/20 1554      PT  SHORT TERM GOAL #2   Title Pt will demonstrate R DF MMT of 3+/5    Baseline 3/5    Status Partially Met             PT Long Term Goals - 01/21/20 1555      PT LONG TERM GOAL #1   Title Pt will demonstrate lumbar AROM WFL without reports of increase in LBP/radiating pain    Status On-going      PT LONG TERM GOAL #2   Title Pt will report ability to sit >30 minutes without increase in LBP or radiating pain    Status On-going      PT LONG TERM GOAL #3   Title Pt will report resolution of radiating pain in RLE    Status On-going      PT LONG TERM GOAL #4   Title Pt will demonstrate RLE MMT equivalent to LLE  Status On-going      PT LONG TERM GOAL #5   Title Pt will report reduction in pain by 50%    Status Achieved                 Plan - 01/28/20 1655    Clinical Impression Statement Pt presents to clinic with gait much improved from last rx; reports that along with PT, massage therapist this past weekend helped a lot. Pt still demonstrates R foot drop with trace contractions in ant tib. Pt tolerated progression of TE well; lumbar ext with black TB increased LBP so d/c that ex this rx. Pt reports relief with traction.    PT Treatment/Interventions ADLs/Self Care Home Management;Cryotherapy;Electrical Stimulation;Ultrasound;Traction;Moist Heat;Iontophoresis 79m/ml Dexamethasone;Gait training;Stair training;Functional mobility training;Therapeutic activities;Therapeutic exercise;Balance training;Neuromuscular re-education;Manual techniques;Patient/family education;Passive range of motion;Dry needling;Taping    PT Next Visit Plan progress lumbar exs and core stab. traction as indicated    Consulted and Agree with Plan of Care Patient           Patient will benefit from skilled therapeutic intervention in order to improve the following deficits and impairments:  Abnormal gait, Decreased range of motion, Difficulty walking, Decreased endurance, Increased muscle spasms,  Decreased activity tolerance, Pain, Decreased balance, Hypomobility, Impaired flexibility, Improper body mechanics, Decreased mobility, Decreased strength, Impaired sensation, Postural dysfunction  Visit Diagnosis: Difficulty in walking, not elsewhere classified  Acute right-sided low back pain with right-sided sciatica  Muscle weakness (generalized)     Problem List There are no problems to display for this patient.  AAmador Cunas PT, DPT ADonald ProseSugg 01/28/2020, 4:59 PM  [address] [address] [address]Phone: [phone]  Fax:  [phone] Name: SYOLANDA DOCKENDORFMRN: [id number] of Birth: [birth year]

## 2020-02-01 ENCOUNTER — Ambulatory Visit: Payer: No Typology Code available for payment source | Admitting: Physical Therapy

## 2020-02-02 ENCOUNTER — Ambulatory Visit: Payer: No Typology Code available for payment source | Admitting: Physical Therapy

## 2020-02-02 ENCOUNTER — Encounter: Payer: Self-pay | Admitting: Physical Therapy

## 2020-02-02 ENCOUNTER — Other Ambulatory Visit: Payer: Self-pay

## 2020-02-02 DIAGNOSIS — M6281 Muscle weakness (generalized): Secondary | ICD-10-CM

## 2020-02-02 DIAGNOSIS — R262 Difficulty in walking, not elsewhere classified: Secondary | ICD-10-CM

## 2020-02-02 DIAGNOSIS — M5441 Lumbago with sciatica, right side: Secondary | ICD-10-CM | POA: Diagnosis not present

## 2020-02-02 NOTE — Therapy (Signed)
[address] [place], [place], [address] Phone: [phone]   Fax:  [phone]  Physical Therapy Treatment  Patient Details  Name: Tristan Gray MRN: [id number] Date of Birth: 1960/11/20 Referring Provider (PT): Duffy Rhody   Encounter Date: 02/02/2020   PT End of Session - 02/02/20 1415    Visit Number 8    Date for PT Re-Evaluation 03/05/20    PT Start Time 1345    PT Stop Time 1430    PT Time Calculation (min) 45 min    Activity Tolerance Patient tolerated treatment well    Behavior During Therapy [facility] for tasks assessed/performed           Past Medical History:  Diagnosis Date  . Hypertension     Past Surgical History:  Procedure Laterality Date  . [place] SURGERY  2008    There were no vitals filed for this visit.   Subjective Assessment - 02/02/20 1346    Subjective Bad day today, back is a little sore, its going down the leg, I must have slept wrong    Currently in Pain? Yes    Pain Score 6     Pain Location Back                             [facility] Adult PT Treatment/Exercise - 02/02/20 0001      Lumbar Exercises: Aerobic   UBE (Upper Arm Bike) constant work  5 min      Lumbar Exercises: Machines for Strengthening   Leg Press 40# BLE 2x10, 40lb heel raises 2x15    Other Lumbar Machine Exercise lats and rows 45# 3x15      Lumbar Exercises: Standing   Other Standing Lumbar Exercises Shoulder Ext 10lb 2x10     Other Standing Lumbar Exercises forward step ups 6in x10 each, Lateral 6in step ups x 10 each      Traction   Type of Traction Lumbar    Min (lbs) 65    Max (lbs) 75    Hold Time 60    Rest Time 20    Time 12                    PT Short Term Goals - 01/21/20 1554      PT SHORT TERM GOAL #2   Title Pt will demonstrate R DF MMT of 3+/5    Baseline 3/5    Status Partially Met             PT Long Term Goals - 02/02/20 1416       PT LONG TERM GOAL #1   Title Pt will demonstrate lumbar AROM WFL without reports of increase in LBP/radiating pain    Status On-going                 Plan - 02/02/20 1416    Clinical Impression Statement Despite soreness pt able to complete all of today's interventions. Avoided leg press contralateral machine strengthening to keep pressures on low back equal. Pt appears to have some balance instability with step up interventions. Additional reps tolerated with seated rows and lats without issue.    Personal Factors and Comorbidities Comorbidity 1;Past/Current Experience    Comorbidities HTN    Examination-Participation Restrictions Cleaning;Community Activity;Driving;Interpersonal Relationship;[facility]    Stability/Clinical Decision Making Evolving/Moderate complexity    Rehab Potential Good  PT Frequency 2x / week    PT Duration 8 weeks    PT Treatment/Interventions ADLs/Self Care Home Management;Cryotherapy;Electrical Stimulation;Ultrasound;Traction;Moist Heat;Iontophoresis 72m/ml Dexamethasone;Gait training;Stair training;Functional mobility training;Therapeutic activities;Therapeutic exercise;Balance training;Neuromuscular re-education;Manual techniques;Patient/family education;Passive range of motion;Dry needling;Taping    PT Next Visit Plan progress lumbar exs and core stab. traction as indicated           Patient will benefit from skilled therapeutic intervention in order to improve the following deficits and impairments:  Abnormal gait, Decreased range of motion, Difficulty walking, Decreased endurance, Increased muscle spasms, Decreased activity tolerance, Pain, Decreased balance, Hypomobility, Impaired flexibility, Improper body mechanics, Decreased mobility, Decreased strength, Impaired sensation, Postural dysfunction  Visit Diagnosis: Acute right-sided low back pain with right-sided sciatica  Muscle weakness (generalized)  Difficulty in walking, not  elsewhere classified     Problem List There are no problems to display for this patient.   RScot Jun PTA 02/02/2020, 2:20 PM  [address] [address]Phone: [phone]  Fax:  [phone] Name: Tristan BAILMRN: [id number] of Birth: 811-19-1962

## 2020-02-09 ENCOUNTER — Ambulatory Visit: Payer: No Typology Code available for payment source | Admitting: Physical Therapy

## 2020-02-10 ENCOUNTER — Encounter: Payer: Self-pay | Admitting: Physical Therapy

## 2020-02-10 ENCOUNTER — Ambulatory Visit: Payer: No Typology Code available for payment source | Admitting: Physical Therapy

## 2020-02-10 ENCOUNTER — Other Ambulatory Visit: Payer: Self-pay

## 2020-02-10 DIAGNOSIS — M5441 Lumbago with sciatica, right side: Secondary | ICD-10-CM | POA: Diagnosis not present

## 2020-02-10 DIAGNOSIS — R262 Difficulty in walking, not elsewhere classified: Secondary | ICD-10-CM

## 2020-02-10 DIAGNOSIS — M6281 Muscle weakness (generalized): Secondary | ICD-10-CM

## 2020-02-10 NOTE — Therapy (Signed)
[address] [place], [place], [address] Phone: [phone]   Fax:  [phone]  Physical Therapy Treatment  Patient Details  Name: Tristan Gray MRN: [id number] Date of Birth: Mar 26, 1961 Referring Provider (PT): Duffy Rhody   Encounter Date: 02/10/2020   PT End of Session - 02/10/20 1135    Visit Number 9    Date for PT Re-Evaluation 03/05/20    PT Start Time 1100    PT Stop Time 1147    PT Time Calculation (min) 47 min    Activity Tolerance Patient tolerated treatment well    Behavior During Therapy [facility] for tasks assessed/performed           Past Medical History:  Diagnosis Date  . Hypertension     Past Surgical History:  Procedure Laterality Date  . [place] SURGERY  2008    There were no vitals filed for this visit.   Subjective Assessment - 02/10/20 1102    Subjective Pt reports that he is doing better overall. Is beginning to feel a deep ache in front of R shin down to R toe; pain is waking him up at night and seems to be unaffected by change in position.    Currently in Pain? Yes    Pain Score 3     Pain Location Leg    Pain Orientation Right                             [facility] Adult PT Treatment/Exercise - 02/10/20 0001      Lumbar Exercises: Aerobic   Recumbent Bike 7 min      Lumbar Exercises: Machines for Strengthening   Other Lumbar Machine Exercise lats and rows 45# 3x15    Other Lumbar Machine Exercise standing shoulder ext 15# 3x15, AR press 25# 2x15 B      Lumbar Exercises: Standing   Heel Raises 15 reps    Heel Raises Limitations 3 sets x15    Other Standing Lumbar Exercises resisted gait 50# x4 each direction      Traction   Type of Traction Lumbar    Min (lbs) 65    Max (lbs) 75    Hold Time 60    Rest Time 20    Time 12                    PT Short Term Goals - 01/21/20 1554      PT SHORT TERM GOAL #2   Title Pt will  demonstrate R DF MMT of 3+/5    Baseline 3/5    Status Partially Met             PT Long Term Goals - 02/02/20 1416      PT LONG TERM GOAL #1   Title Pt will demonstrate lumbar AROM WFL without reports of increase in LBP/radiating pain    Status On-going                 Plan - 02/10/20 1135    Clinical Impression Statement Pt tolerated progression of TE well with no complaints of increased LBP. Pt still has difficulty tolerating extension based ex's with reports of pinching sensation in LB. Pt reported new aching pain waking him up in the night recently; pt also stated he would like to discuss CT scan with MD. Encouraged pt to make follow up appt with  MD to discuss concerns with pt verbalized understanding/agreement. Pt reports relief with traction.    PT Treatment/Interventions ADLs/Self Care Home Management;Cryotherapy;Electrical Stimulation;Ultrasound;Traction;Moist Heat;Iontophoresis 65m/ml Dexamethasone;Gait training;Stair training;Functional mobility training;Therapeutic activities;Therapeutic exercise;Balance training;Neuromuscular re-education;Manual techniques;Patient/family education;Passive range of motion;Dry needling;Taping    PT Next Visit Plan progress lumbar exs and core stab. traction as indicated    Consulted and Agree with Plan of Care Patient           Patient will benefit from skilled therapeutic intervention in order to improve the following deficits and impairments:  Abnormal gait, Decreased range of motion, Difficulty walking, Decreased endurance, Increased muscle spasms, Decreased activity tolerance, Pain, Decreased balance, Hypomobility, Impaired flexibility, Improper body mechanics, Decreased mobility, Decreased strength, Impaired sensation, Postural dysfunction  Visit Diagnosis: Acute right-sided low back pain with right-sided sciatica  Muscle weakness (generalized)  Difficulty in walking, not elsewhere classified     Problem List There are  no problems to display for this patient.  AAmador Cunas PT, DPT ADonald ProseSugg 02/10/2020, 11:38 AM  [address] [address]Phone: [phone]  Fax:  [phone] Name: Tristan HAGGARMRN: [id number] of Birth: 8May 28, 1962

## 2020-02-16 ENCOUNTER — Ambulatory Visit: Payer: No Typology Code available for payment source | Attending: Neurosurgery | Admitting: Physical Therapy

## 2020-02-18 ENCOUNTER — Ambulatory Visit: Payer: No Typology Code available for payment source | Admitting: Physical Therapy

## 2020-02-23 ENCOUNTER — Ambulatory Visit: Payer: No Typology Code available for payment source | Admitting: Physical Therapy

## 2020-03-13 ENCOUNTER — Emergency Department (HOSPITAL_BASED_OUTPATIENT_CLINIC_OR_DEPARTMENT_OTHER)
Admission: EM | Admit: 2020-03-13 | Discharge: 2020-03-13 | Disposition: A | Discharge status: Home / Self Care | Payer: No Typology Code available for payment source | Attending: Emergency Medicine | Admitting: Emergency Medicine

## 2020-03-13 ENCOUNTER — Emergency Department (HOSPITAL_BASED_OUTPATIENT_CLINIC_OR_DEPARTMENT_OTHER): Payer: No Typology Code available for payment source

## 2020-03-13 ENCOUNTER — Other Ambulatory Visit: Payer: Self-pay

## 2020-03-13 ENCOUNTER — Encounter (HOSPITAL_BASED_OUTPATIENT_CLINIC_OR_DEPARTMENT_OTHER): Payer: Self-pay | Admitting: Emergency Medicine

## 2020-03-13 DIAGNOSIS — M47819 Spondylosis without myelopathy or radiculopathy, site unspecified: Secondary | ICD-10-CM

## 2020-03-13 DIAGNOSIS — S161XXA Strain of muscle, fascia and tendon at neck level, initial encounter: Secondary | ICD-10-CM | POA: Diagnosis present

## 2020-03-13 DIAGNOSIS — S39012A Strain of muscle, fascia and tendon of lower back, initial encounter: Secondary | ICD-10-CM | POA: Diagnosis not present

## 2020-03-13 DIAGNOSIS — Y939 Activity, unspecified: Secondary | ICD-10-CM | POA: Diagnosis not present

## 2020-03-13 DIAGNOSIS — Y92009 Unspecified place in unspecified non-institutional (private) residence as the place of occurrence of the external cause: Secondary | ICD-10-CM | POA: Diagnosis not present

## 2020-03-13 DIAGNOSIS — M479 Spondylosis, unspecified: Secondary | ICD-10-CM | POA: Insufficient documentation

## 2020-03-13 DIAGNOSIS — Y998 Other external cause status: Secondary | ICD-10-CM | POA: Diagnosis not present

## 2020-03-13 DIAGNOSIS — I10 Essential (primary) hypertension: Secondary | ICD-10-CM | POA: Diagnosis not present

## 2020-03-13 MED ORDER — CYCLOBENZAPRINE HCL 10 MG PO TABS: 5.0000 mg | ORAL_TABLET | Freq: Two times a day (BID) | ORAL | 0 refills | Status: DC | PRN

## 2020-03-13 MED ORDER — CELECOXIB 200 MG PO CAPS: 200.0000 mg | ORAL_CAPSULE | Freq: Two times a day (BID) | ORAL | 0 refills | Status: DC
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
## 2020-03-29 ENCOUNTER — Other Ambulatory Visit: Payer: Self-pay | Admitting: Neurosurgery

## 2020-04-05 NOTE — Progress Notes (Signed)
[id number] [place], [place] - [address] [place] [address] Phone: [phone] Fax: [phone]      Your procedure is scheduled on August 31  Report to [facility] Main Entrance "A" at 0530 A.M., and check in at the Admitting office.  Call this number if you have problems the morning of surgery:  [phone]  Call [phone] if you have any questions prior to your surgery date Monday-Friday 8am-4pm    Remember:  Do not eat  Or drink after midnight the night before your surgery     Take these medicines the morning of surgery with A SIP OF WATER  buPROPion (WELLBUTRIN XL)  FLUoxetine (PROZAC)  gabapentin (NEURONTIN) HYDROcodone-acetaminophen (NORCO/VICODIN) if needed methocarbamol (ROBAXIN)  If needed   As of today, STOP taking any Aspirin (unless otherwise instructed by your surgeon) Aleve, Naproxen, Ibuprofen, Motrin, Advil, Goody's, BC's, all herbal medications, fish oil, and all vitamins. meloxicam (MOBIC)                      Do not wear jewelry            Do not wear lotions, powders, colognes, or deodorant.            Men may shave face and neck.            Do not bring valuables to the hospital.            [facility] is not responsible for any belongings or valuables.  Do NOT Smoke (Tobacco/Vaping) or drink Alcohol 24 hours prior to your procedure If you use a CPAP at night, you may bring all equipment for your overnight stay.   Contacts, glasses, dentures or bridgework may not be worn into surgery.      For patients admitted to the hospital, discharge time will be determined by your treatment team.   Patients discharged the day of surgery will not be allowed to drive home, and someone needs to stay with them for 24 hours.    Special instructions:   [place]- Preparing For Surgery  Before surgery, you can play an important role. Because skin is not sterile, your skin needs to be as free of germs as possible.  You can reduce the number of germs on your skin by washing with CHG (chlorahexidine gluconate) Soap before surgery.  CHG is an antiseptic cleaner which kills germs and bonds with the skin to continue killing germs even after washing.    Oral Hygiene is also important to reduce your risk of infection.  Remember - BRUSH YOUR TEETH THE MORNING OF SURGERY WITH YOUR REGULAR TOOTHPASTE  Please do not use if you have an allergy to CHG or antibacterial soaps. If your skin becomes reddened/irritated stop using the CHG.  Do not shave (including legs and underarms) for at least 48 hours prior to first CHG shower. It is OK to shave your face.  Please follow these instructions carefully.   1. Shower the NIGHT BEFORE SURGERY and the MORNING OF SURGERY with CHG Soap.   2. If you chose to wash your hair, wash your hair first as usual with your normal shampoo.  3. After you shampoo, rinse your hair and body thoroughly to remove the shampoo.  4. Use CHG as you would any other liquid soap. You can apply CHG directly to the skin and wash gently with a scrungie or a clean washcloth.   5. Apply the CHG Soap to  your body ONLY FROM THE NECK DOWN.  Do not use on open wounds or open sores. Avoid contact with your eyes, ears, mouth and genitals (private parts). Wash Face and genitals (private parts)  with your normal soap.   6. Wash thoroughly, paying special attention to the area where your surgery will be performed.  7. Thoroughly rinse your body with warm water from the neck down.  8. DO NOT shower/wash with your normal soap after using and rinsing off the CHG Soap.  9. Pat yourself dry with a CLEAN TOWEL.  10. Wear CLEAN PAJAMAS to bed the night before surgery  11. Place CLEAN SHEETS on your bed the night of your first shower and DO NOT SLEEP WITH PETS.   Day of Surgery: Wear Clean/Comfortable clothing the morning of surgery Do not apply any deodorants/lotions.   Remember to brush your teeth WITH YOUR  REGULAR TOOTHPASTE.   Please read over the following fact sheets that you were given.

## 2020-04-06 ENCOUNTER — Encounter (HOSPITAL_COMMUNITY): Payer: Self-pay

## 2020-04-06 ENCOUNTER — Other Ambulatory Visit: Payer: Self-pay

## 2020-04-06 ENCOUNTER — Encounter (HOSPITAL_COMMUNITY)
Admission: RE | Admit: 2020-04-06 | Discharge: 2020-04-06 | Disposition: A | Discharge status: Home / Self Care | Payer: No Typology Code available for payment source | Source: Ambulatory Visit | Attending: Neurosurgery | Admitting: Neurosurgery

## 2020-04-06 DIAGNOSIS — R9431 Abnormal electrocardiogram [ECG] [EKG]: Secondary | ICD-10-CM | POA: Insufficient documentation

## 2020-04-06 DIAGNOSIS — Z01812 Encounter for preprocedural laboratory examination: Secondary | ICD-10-CM | POA: Insufficient documentation

## 2020-04-06 DIAGNOSIS — Z01810 Encounter for preprocedural cardiovascular examination: Secondary | ICD-10-CM | POA: Insufficient documentation

## 2020-04-06 HISTORY — DX: Personal history of urinary calculi: Z87.442

## 2020-04-06 LAB — CBC
HCT: 48.9 % (ref 39.0–52.0)
Hemoglobin: 16.6 g/dL (ref 13.0–17.0)
MCH: 32.7 pg (ref 26.0–34.0)
MCHC: 33.9 g/dL (ref 30.0–36.0)
MCV: 96.3 fL (ref 80.0–100.0)
Platelets: 179 10*3/uL (ref 150–400)
RBC: 5.08 MIL/uL (ref 4.22–5.81)
RDW: 12.7 % (ref 11.5–15.5)
WBC: 6.1 10*3/uL (ref 4.0–10.5)
nRBC: 0 % (ref 0.0–0.2)

## 2020-04-06 LAB — BASIC METABOLIC PANEL
Anion gap: 10 (ref 5–15)
BUN: 10 mg/dL (ref 6–20)
CO2: 20 mmol/L — ABNORMAL LOW (ref 22–32)
Calcium: 9.1 mg/dL (ref 8.9–10.3)
Chloride: 109 mmol/L (ref 98–111)
Creatinine, Ser: 0.76 mg/dL (ref 0.61–1.24)
GFR calc Af Amer: >60 mL/min (ref >60)
GFR calc non Af Amer: >60 mL/min (ref >60)
Glucose, Bld: 90 mg/dL (ref 70–99)
Potassium: 4 mmol/L (ref 3.5–5.1)
Sodium: 139 mmol/L (ref 135–145)

## 2020-04-06 LAB — SURGICAL PCR SCREEN
MRSA, PCR: NEGATIVE
Staphylococcus aureus: NEGATIVE

## 2020-04-06 LAB — TYPE AND SCREEN
ABO/RH(D): O POS
Antibody Screen: NEGATIVE

## 2020-04-06 NOTE — Progress Notes (Signed)
PCP:  Kip C Cardiologist:  Denies  EKG:  04/06/20 CXR:  N/A ECHO:  N/A Stress Test:  Denies Cardiac Cath:  Denies  Covid test 04/08/20  Patient denies shortness of breath, fever, cough, and chest pain at PAT appointment.  Patient verbalized understanding of instructions provided today at the PAT appointment.  Patient asked to review instructions at home and day of surgery.

## 2020-04-09 ENCOUNTER — Other Ambulatory Visit (HOSPITAL_COMMUNITY): Payer: No Typology Code available for payment source

## 2020-04-09 ENCOUNTER — Other Ambulatory Visit (HOSPITAL_COMMUNITY)
Admission: RE | Admit: 2020-04-09 | Discharge: 2020-04-09 | Disposition: A | Discharge status: Home / Self Care | Payer: No Typology Code available for payment source | Source: Ambulatory Visit | Attending: Neurosurgery | Admitting: Neurosurgery

## 2020-04-09 DIAGNOSIS — Z01812 Encounter for preprocedural laboratory examination: Secondary | ICD-10-CM | POA: Insufficient documentation

## 2020-04-09 DIAGNOSIS — Z20822 Contact with and (suspected) exposure to covid-19: Secondary | ICD-10-CM | POA: Insufficient documentation

## 2020-04-09 LAB — SARS CORONAVIRUS 2 (TAT 6-24 HRS): SARS Coronavirus 2: NEGATIVE

## 2020-04-12 ENCOUNTER — Inpatient Hospital Stay (HOSPITAL_COMMUNITY): Payer: No Typology Code available for payment source

## 2020-04-12 ENCOUNTER — Inpatient Hospital Stay (HOSPITAL_COMMUNITY): Payer: No Typology Code available for payment source | Admitting: Certified Registered Nurse Anesthetist

## 2020-04-12 ENCOUNTER — Other Ambulatory Visit: Payer: Self-pay

## 2020-04-12 ENCOUNTER — Inpatient Hospital Stay (HOSPITAL_COMMUNITY)
Admission: RE | Admit: 2020-04-12 | Discharge: 2020-04-13 | DRG: 455 | Disposition: A | Discharge status: Home / Self Care | Payer: No Typology Code available for payment source | Attending: Neurosurgery | Admitting: Neurosurgery

## 2020-04-12 ENCOUNTER — Encounter (HOSPITAL_COMMUNITY)
Admission: RE | Disposition: A | Discharge status: Home / Self Care | Payer: Self-pay | Source: Home / Self Care | Attending: Neurosurgery

## 2020-04-12 ENCOUNTER — Encounter (HOSPITAL_COMMUNITY): Payer: Self-pay

## 2020-04-12 DIAGNOSIS — Z20822 Contact with and (suspected) exposure to covid-19: Secondary | ICD-10-CM | POA: Diagnosis present

## 2020-04-12 DIAGNOSIS — M4316 Spondylolisthesis, lumbar region: Secondary | ICD-10-CM | POA: Diagnosis present

## 2020-04-12 DIAGNOSIS — I10 Essential (primary) hypertension: Secondary | ICD-10-CM | POA: Diagnosis present

## 2020-04-12 DIAGNOSIS — Z79899 Other long term (current) drug therapy: Secondary | ICD-10-CM

## 2020-04-12 DIAGNOSIS — Z87442 Personal history of urinary calculi: Secondary | ICD-10-CM | POA: Diagnosis not present

## 2020-04-12 DIAGNOSIS — M48061 Spinal stenosis, lumbar region without neurogenic claudication: Secondary | ICD-10-CM | POA: Diagnosis present

## 2020-04-12 DIAGNOSIS — M5416 Radiculopathy, lumbar region: Secondary | ICD-10-CM | POA: Diagnosis present

## 2020-04-12 DIAGNOSIS — M5126 Other intervertebral disc displacement, lumbar region: Principal | ICD-10-CM | POA: Diagnosis present

## 2020-04-12 DIAGNOSIS — M4317 Spondylolisthesis, lumbosacral region: Secondary | ICD-10-CM | POA: Diagnosis present

## 2020-04-12 DIAGNOSIS — F419 Anxiety disorder, unspecified: Secondary | ICD-10-CM | POA: Diagnosis present

## 2020-04-12 DIAGNOSIS — Z791 Long term (current) use of non-steroidal anti-inflammatories (NSAID): Secondary | ICD-10-CM | POA: Diagnosis not present

## 2020-04-12 DIAGNOSIS — M5116 Intervertebral disc disorders with radiculopathy, lumbar region: Secondary | ICD-10-CM | POA: Diagnosis present

## 2020-04-12 DIAGNOSIS — Z87891 Personal history of nicotine dependence: Secondary | ICD-10-CM

## 2020-04-12 DIAGNOSIS — Z880 Allergy status to penicillin: Secondary | ICD-10-CM | POA: Diagnosis not present

## 2020-04-12 DIAGNOSIS — Z888 Allergy status to other drugs, medicaments and biological substances status: Secondary | ICD-10-CM

## 2020-04-12 DIAGNOSIS — M21371 Foot drop, right foot: Secondary | ICD-10-CM | POA: Diagnosis present

## 2020-04-12 DIAGNOSIS — Z419 Encounter for procedure for purposes other than remedying health state, unspecified: Secondary | ICD-10-CM

## 2020-04-12 LAB — ABO/RH: ABO/RH(D): O POS

## 2020-04-12 SURGERY — POSTERIOR LUMBAR FUSION 2 LEVEL
Anesthesia: General

## 2020-04-12 MED ORDER — ONDANSETRON HCL 4 MG/2ML IJ SOLN
INTRAMUSCULAR | Status: DC | PRN
  Administered 2020-04-12: 4 mg via INTRAVENOUS

## 2020-04-12 MED ORDER — SODIUM CHLORIDE (PF) 0.9 % IJ SOLN
INTRAMUSCULAR | Status: DC | PRN
  Administered 2020-04-12: 10 mL via INTRAVENOUS

## 2020-04-12 MED ORDER — BUPIVACAINE LIPOSOME 1.3 % IJ SUSP
20.0000 mL | Freq: Once | INTRAMUSCULAR | Status: AC
  Administered 2020-04-12: 20 mL
  Filled 2020-04-12: qty 20

## 2020-04-12 MED ORDER — ONDANSETRON HCL 4 MG/2ML IJ SOLN
4.0000 mg | Freq: Four times a day (QID) | INTRAMUSCULAR | Status: DC | PRN
  Administered 2020-04-12: 4 mg via INTRAVENOUS

## 2020-04-12 MED ORDER — ONDANSETRON HCL 4 MG/2ML IJ SOLN
INTRAMUSCULAR | Status: AC
  Filled 2020-04-12: qty 2

## 2020-04-12 MED ORDER — ONDANSETRON HCL 4 MG PO TABS: 4.0000 mg | ORAL_TABLET | Freq: Four times a day (QID) | ORAL | Status: DC | PRN

## 2020-04-12 MED ORDER — VANCOMYCIN HCL 1250 MG/250ML IV SOLN
1250.0000 mg | Freq: Two times a day (BID) | INTRAVENOUS | Status: DC
  Administered 2020-04-12 – 2020-04-13 (×2): 1250 mg via INTRAVENOUS
  Filled 2020-04-12 (×4): qty 250

## 2020-04-12 MED ORDER — ORAL CARE MOUTH RINSE: 15.0000 mL | Freq: Once | OROMUCOSAL | Status: AC

## 2020-04-12 MED ORDER — MORPHINE SULFATE (PF) 2 MG/ML IV SOLN
2.0000 mg | INTRAVENOUS | Status: DC | PRN
  Administered 2020-04-12: 2 mg via INTRAVENOUS
  Filled 2020-04-12: qty 1

## 2020-04-12 MED ORDER — OXYCODONE HCL 5 MG/5ML PO SOLN: 5.0000 mg | Freq: Once | ORAL | Status: DC | PRN

## 2020-04-12 MED ORDER — ACETAMINOPHEN 10 MG/ML IV SOLN
INTRAVENOUS | Status: AC
  Filled 2020-04-12: qty 100

## 2020-04-12 MED ORDER — CHLORHEXIDINE GLUCONATE 0.12 % MT SOLN
15.0000 mL | Freq: Once | OROMUCOSAL | Status: AC
  Administered 2020-04-12: 15 mL via OROMUCOSAL
  Filled 2020-04-12: qty 15

## 2020-04-12 MED ORDER — TRAZODONE HCL 100 MG PO TABS
100.0000 mg | ORAL_TABLET | Freq: Every evening | ORAL | Status: DC | PRN
  Administered 2020-04-12: 100 mg via ORAL
  Filled 2020-04-12 (×2): qty 1

## 2020-04-12 MED ORDER — FENTANYL CITRATE (PF) 100 MCG/2ML IJ SOLN
INTRAMUSCULAR | Status: AC
  Filled 2020-04-12: qty 2

## 2020-04-12 MED ORDER — ALBUMIN HUMAN 5 % IV SOLN
12.5000 g | Freq: Once | INTRAVENOUS | Status: AC
  Administered 2020-04-12: 12.5 g via INTRAVENOUS

## 2020-04-12 MED ORDER — ACETAMINOPHEN 500 MG PO TABS: 1000.0000 mg | ORAL_TABLET | Freq: Once | ORAL | Status: DC | PRN

## 2020-04-12 MED ORDER — ENOXAPARIN SODIUM 40 MG/0.4ML ~~LOC~~ SOLN
40.0000 mg | SUBCUTANEOUS | Status: DC
  Administered 2020-04-13: 40 mg via SUBCUTANEOUS
  Filled 2020-04-12 (×2): qty 0.4

## 2020-04-12 MED ORDER — MIDAZOLAM HCL 5 MG/5ML IJ SOLN
INTRAMUSCULAR | Status: DC | PRN
  Administered 2020-04-12: 2 mg via INTRAVENOUS

## 2020-04-12 MED ORDER — THROMBIN 5000 UNITS EX SOLR
CUTANEOUS | Status: AC
  Filled 2020-04-12: qty 5000

## 2020-04-12 MED ORDER — LIDOCAINE-EPINEPHRINE 1 %-1:100000 IJ SOLN
INTRAMUSCULAR | Status: AC
  Filled 2020-04-12: qty 1

## 2020-04-12 MED ORDER — ACETAMINOPHEN 650 MG RE SUPP: 650.0000 mg | RECTAL | Status: DC | PRN

## 2020-04-12 MED ORDER — SODIUM CHLORIDE 0.9% FLUSH: 3.0000 mL | INTRAVENOUS | Status: DC | PRN

## 2020-04-12 MED ORDER — MENTHOL 3 MG MT LOZG: 1.0000 | LOZENGE | OROMUCOSAL | Status: DC | PRN

## 2020-04-12 MED ORDER — ACETAMINOPHEN 160 MG/5ML PO SOLN: 1000.0000 mg | Freq: Once | ORAL | Status: DC | PRN

## 2020-04-12 MED ORDER — THROMBIN 5000 UNITS EX SOLR
OROMUCOSAL | Status: DC | PRN
  Administered 2020-04-12: 5 mL

## 2020-04-12 MED ORDER — LIDOCAINE 2% (20 MG/ML) 5 ML SYRINGE
INTRAMUSCULAR | Status: DC | PRN
  Administered 2020-04-12: 40 mg via INTRAVENOUS
  Administered 2020-04-12: 60 mg via INTRAVENOUS

## 2020-04-12 MED ORDER — PHENYLEPHRINE HCL (PRESSORS) 10 MG/ML IV SOLN
INTRAVENOUS | Status: DC | PRN
  Administered 2020-04-12: 120 ug via INTRAVENOUS

## 2020-04-12 MED ORDER — VANCOMYCIN HCL IN DEXTROSE 1-5 GM/200ML-% IV SOLN
1000.0000 mg | INTRAVENOUS | Status: AC
  Administered 2020-04-12: 1000 mg via INTRAVENOUS
  Filled 2020-04-12: qty 200

## 2020-04-12 MED ORDER — BACITRACIN ZINC 500 UNIT/GM EX OINT
TOPICAL_OINTMENT | CUTANEOUS | Status: DC | PRN
  Administered 2020-04-12: 1 via TOPICAL

## 2020-04-12 MED ORDER — PRAVASTATIN SODIUM 40 MG PO TABS
40.0000 mg | ORAL_TABLET | Freq: Every day | ORAL | Status: DC
  Administered 2020-04-12: 40 mg via ORAL
  Filled 2020-04-12: qty 1

## 2020-04-12 MED ORDER — LACTATED RINGERS IV SOLN: INTRAVENOUS | Status: DC

## 2020-04-12 MED ORDER — AMLODIPINE BESYLATE 5 MG PO TABS: 10.0000 mg | ORAL_TABLET | Freq: Every day | ORAL | Status: DC

## 2020-04-12 MED ORDER — PROPOFOL 10 MG/ML IV BOLUS
INTRAVENOUS | Status: AC
  Filled 2020-04-12: qty 40

## 2020-04-12 MED ORDER — BUPIVACAINE HCL (PF) 0.5 % IJ SOLN
INTRAMUSCULAR | Status: DC | PRN
  Administered 2020-04-12: 5 mL
  Administered 2020-04-12: 30 mL

## 2020-04-12 MED ORDER — CHLORHEXIDINE GLUCONATE CLOTH 2 % EX PADS: 6.0000 | MEDICATED_PAD | Freq: Once | CUTANEOUS | Status: DC

## 2020-04-12 MED ORDER — PHENYLEPHRINE HCL-NACL 10-0.9 MG/250ML-% IV SOLN
INTRAVENOUS | Status: DC | PRN
  Administered 2020-04-12: 25 ug/min via INTRAVENOUS

## 2020-04-12 MED ORDER — OXYCODONE HCL 5 MG PO TABS: 5.0000 mg | ORAL_TABLET | Freq: Once | ORAL | Status: DC | PRN

## 2020-04-12 MED ORDER — FLEET ENEMA 7-19 GM/118ML RE ENEM: 1.0000 | ENEMA | Freq: Once | RECTAL | Status: DC | PRN

## 2020-04-12 MED ORDER — LACTATED RINGERS IV SOLN: INTRAVENOUS | Status: DC | PRN

## 2020-04-12 MED ORDER — BUPIVACAINE HCL (PF) 0.5 % IJ SOLN
INTRAMUSCULAR | Status: AC
  Filled 2020-04-12: qty 30

## 2020-04-12 MED ORDER — ALBUMIN HUMAN 5 % IV SOLN: INTRAVENOUS | Status: DC | PRN

## 2020-04-12 MED ORDER — PHENOL 1.4 % MT LIQD: 1.0000 | OROMUCOSAL | Status: DC | PRN

## 2020-04-12 MED ORDER — LIDOCAINE-EPINEPHRINE 1 %-1:100000 IJ SOLN
INTRAMUSCULAR | Status: DC | PRN
  Administered 2020-04-12: 6 mL

## 2020-04-12 MED ORDER — FLUOXETINE HCL 10 MG PO CAPS
10.0000 mg | ORAL_CAPSULE | Freq: Every day | ORAL | Status: DC
  Administered 2020-04-13: 10 mg via ORAL
  Filled 2020-04-12 (×2): qty 1

## 2020-04-12 MED ORDER — DOCUSATE SODIUM 100 MG PO CAPS
100.0000 mg | ORAL_CAPSULE | Freq: Two times a day (BID) | ORAL | Status: DC
  Administered 2020-04-12 – 2020-04-13 (×2): 100 mg via ORAL
  Filled 2020-04-12 (×2): qty 1

## 2020-04-12 MED ORDER — SODIUM CHLORIDE 0.9% FLUSH
3.0000 mL | Freq: Two times a day (BID) | INTRAVENOUS | Status: DC
  Administered 2020-04-12 – 2020-04-13 (×2): 3 mL via INTRAVENOUS

## 2020-04-12 MED ORDER — BACITRACIN ZINC 500 UNIT/GM EX OINT
TOPICAL_OINTMENT | CUTANEOUS | Status: AC
  Filled 2020-04-12: qty 28.35

## 2020-04-12 MED ORDER — SUGAMMADEX SODIUM 200 MG/2ML IV SOLN
INTRAVENOUS | Status: DC | PRN
  Administered 2020-04-12: 200 mg via INTRAVENOUS

## 2020-04-12 MED ORDER — ALBUMIN HUMAN 5 % IV SOLN
INTRAVENOUS | Status: AC
  Filled 2020-04-12: qty 250

## 2020-04-12 MED ORDER — OXYCODONE-ACETAMINOPHEN 5-325 MG PO TABS
1.0000 | ORAL_TABLET | ORAL | Status: DC | PRN
  Administered 2020-04-12 – 2020-04-13 (×5): 2 via ORAL
  Filled 2020-04-12 (×5): qty 2

## 2020-04-12 MED ORDER — POLYETHYLENE GLYCOL 3350 17 G PO PACK: 17.0000 g | PACK | Freq: Every day | ORAL | Status: DC | PRN

## 2020-04-12 MED ORDER — SODIUM CHLORIDE 0.9 % IV SOLN: 250.0000 mL | INTRAVENOUS | Status: DC

## 2020-04-12 MED ORDER — LACTATED RINGERS IV BOLUS
1000.0000 mL | Freq: Once | INTRAVENOUS | Status: AC
  Administered 2020-04-12: 1000 mL via INTRAVENOUS

## 2020-04-12 MED ORDER — MIDAZOLAM HCL 2 MG/2ML IJ SOLN
INTRAMUSCULAR | Status: AC
  Filled 2020-04-12: qty 2

## 2020-04-12 MED ORDER — PROPOFOL 10 MG/ML IV BOLUS
INTRAVENOUS | Status: DC | PRN
  Administered 2020-04-12: 190 mg via INTRAVENOUS

## 2020-04-12 MED ORDER — ACETAMINOPHEN 10 MG/ML IV SOLN
1000.0000 mg | Freq: Once | INTRAVENOUS | Status: DC | PRN
  Administered 2020-04-12: 1000 mg via INTRAVENOUS

## 2020-04-12 MED ORDER — 0.9 % SODIUM CHLORIDE (POUR BTL) OPTIME
TOPICAL | Status: DC | PRN
  Administered 2020-04-12: 1000 mL

## 2020-04-12 MED ORDER — ROCURONIUM BROMIDE 10 MG/ML (PF) SYRINGE
PREFILLED_SYRINGE | INTRAVENOUS | Status: DC | PRN
  Administered 2020-04-12: 30 mg via INTRAVENOUS
  Administered 2020-04-12 (×4): 20 mg via INTRAVENOUS
  Administered 2020-04-12: 70 mg via INTRAVENOUS

## 2020-04-12 MED ORDER — SODIUM CHLORIDE 0.9 % IV SOLN
INTRAVENOUS | Status: DC | PRN
  Administered 2020-04-12: 500 mL

## 2020-04-12 MED ORDER — BUPROPION HCL ER (XL) 150 MG PO TB24
150.0000 mg | ORAL_TABLET | Freq: Every day | ORAL | Status: DC
  Administered 2020-04-13: 150 mg via ORAL
  Filled 2020-04-12 (×2): qty 1

## 2020-04-12 MED ORDER — DEXAMETHASONE SODIUM PHOSPHATE 10 MG/ML IJ SOLN
INTRAMUSCULAR | Status: DC | PRN
  Administered 2020-04-12: 10 mg via INTRAVENOUS

## 2020-04-12 MED ORDER — POTASSIUM CHLORIDE IN NACL 20-0.9 MEQ/L-% IV SOLN
INTRAVENOUS | Status: DC
  Filled 2020-04-12: qty 1000

## 2020-04-12 MED ORDER — LISINOPRIL 20 MG PO TABS
40.0000 mg | ORAL_TABLET | Freq: Every day | ORAL | Status: DC
  Administered 2020-04-13: 40 mg via ORAL
  Filled 2020-04-12: qty 2

## 2020-04-12 MED ORDER — FENTANYL CITRATE (PF) 250 MCG/5ML IJ SOLN
INTRAMUSCULAR | Status: DC | PRN
Start: 2020-04-12 — End: 2020-04-12
  Administered 2020-04-12 (×3): 50 ug via INTRAVENOUS
  Administered 2020-04-12: 100 ug via INTRAVENOUS

## 2020-04-12 MED ORDER — METHOCARBAMOL 750 MG PO TABS
750.0000 mg | ORAL_TABLET | Freq: Three times a day (TID) | ORAL | Status: DC | PRN
  Administered 2020-04-12 – 2020-04-13 (×2): 750 mg via ORAL
  Filled 2020-04-12 (×3): qty 1

## 2020-04-12 MED ORDER — FENTANYL CITRATE (PF) 100 MCG/2ML IJ SOLN
25.0000 ug | INTRAMUSCULAR | Status: DC | PRN
  Administered 2020-04-12: 50 ug via INTRAVENOUS

## 2020-04-12 MED ORDER — FENTANYL CITRATE (PF) 250 MCG/5ML IJ SOLN
INTRAMUSCULAR | Status: AC
  Filled 2020-04-12: qty 5

## 2020-04-12 MED ORDER — ACETAMINOPHEN 325 MG PO TABS: 650.0000 mg | ORAL_TABLET | ORAL | Status: DC | PRN

## 2020-04-12 SURGICAL SUPPLY — 80 items
BAG DECANTER FOR FLEXI CONT (MISCELLANEOUS) ×3 IMPLANT
BAND RUBBER #18 3X1/16 STRL (MISCELLANEOUS) IMPLANT
BASKET BONE COLLECTION (BASKET) ×3 IMPLANT
BLADE CLIPPER SURG (BLADE) IMPLANT
BLADE SURG 11 STRL SS (BLADE) IMPLANT
BUR MATCHSTICK NEURO 3.0 LAGG (BURR) ×3 IMPLANT
BUR PRECISION FLUTE 5.0 (BURR) ×3 IMPLANT
CAGE SABLE 10X30 6-12 8D (Cage) ×3 IMPLANT
CAGE SABLE 10X30 9-16 8D (Cage) ×6 IMPLANT
CANISTER SUCT 3000ML PPV (MISCELLANEOUS) ×3 IMPLANT
CNTNR URN SCR LID CUP LEK RST (MISCELLANEOUS) ×1 IMPLANT
CONT SPEC 4OZ STRL OR WHT (MISCELLANEOUS) ×2
COVER BACK TABLE 60X90IN (DRAPES) ×3 IMPLANT
COVER WAND RF STERILE (DRAPES) ×3 IMPLANT
DECANTER SPIKE VIAL GLASS SM (MISCELLANEOUS) ×3 IMPLANT
DERMABOND ADVANCED (GAUZE/BANDAGES/DRESSINGS) ×2
DERMABOND ADVANCED .7 DNX12 (GAUZE/BANDAGES/DRESSINGS) ×1 IMPLANT
DRAIN JACKSON PRATT 10MM FLAT (MISCELLANEOUS) ×3 IMPLANT
DRAIN JACKSON PRT FLT 10 (DRAIN) ×3 IMPLANT
DRAPE 3/4 80X56 (DRAPES) ×3 IMPLANT
DRAPE C-ARM 42X72 X-RAY (DRAPES) ×3 IMPLANT
DRAPE C-ARMOR (DRAPES) ×3 IMPLANT
DRAPE LAPAROTOMY 100X72X124 (DRAPES) ×3 IMPLANT
DRAPE MICROSCOPE LEICA (MISCELLANEOUS) IMPLANT
DRSG OPSITE POSTOP 4X6 (GAUZE/BANDAGES/DRESSINGS) IMPLANT
DRSG OPSITE POSTOP 4X8 (GAUZE/BANDAGES/DRESSINGS) ×3 IMPLANT
DURAPREP 26ML APPLICATOR (WOUND CARE) ×3 IMPLANT
ELECT COATED BLADE 2.86 ST (ELECTRODE) ×3 IMPLANT
ELECT REM PT RETURN 9FT ADLT (ELECTROSURGICAL) ×3
ELECTRODE REM PT RTRN 9FT ADLT (ELECTROSURGICAL) ×1 IMPLANT
EVACUATOR SILICONE 100CC (DRAIN) ×6 IMPLANT
GAUZE 4X4 16PLY RFD (DISPOSABLE) IMPLANT
GAUZE SPONGE 4X4 12PLY STRL (GAUZE/BANDAGES/DRESSINGS) ×3 IMPLANT
GLOVE BIOGEL PI IND STRL 6.5 (GLOVE) ×1 IMPLANT
GLOVE BIOGEL PI IND STRL 7.5 (GLOVE) ×1 IMPLANT
GLOVE BIOGEL PI IND STRL 8 (GLOVE) ×1 IMPLANT
GLOVE BIOGEL PI INDICATOR 6.5 (GLOVE) ×2
GLOVE BIOGEL PI INDICATOR 7.5 (GLOVE) ×2
GLOVE BIOGEL PI INDICATOR 8 (GLOVE) ×2
GLOVE ECLIPSE 7.5 STRL STRAW (GLOVE) ×18 IMPLANT
GLOVE ECLIPSE 8.0 STRL XLNG CF (GLOVE) ×3 IMPLANT
GLOVE EXAM NITRILE XL STR (GLOVE) IMPLANT
GLOVE SURG SS PI 8.0 STRL IVOR (GLOVE) ×6 IMPLANT
GOWN STRL REUS W/ TWL LRG LVL3 (GOWN DISPOSABLE) ×1 IMPLANT
GOWN STRL REUS W/ TWL XL LVL3 (GOWN DISPOSABLE) ×3 IMPLANT
GOWN STRL REUS W/TWL 2XL LVL3 (GOWN DISPOSABLE) ×6 IMPLANT
GOWN STRL REUS W/TWL LRG LVL3 (GOWN DISPOSABLE) ×2
GOWN STRL REUS W/TWL XL LVL3 (GOWN DISPOSABLE) ×6
GRAFT TRINITY ELITE LGE HUMAN (Tissue) ×3 IMPLANT
HEMOSTAT POWDER KIT SURGIFOAM (HEMOSTASIS) ×3 IMPLANT
KIT BASIN OR (CUSTOM PROCEDURE TRAY) ×3 IMPLANT
KIT TURNOVER KIT B (KITS) ×3 IMPLANT
MILL MEDIUM DISP (BLADE) ×3 IMPLANT
NEEDLE HYPO 18GX1.5 BLUNT FILL (NEEDLE) IMPLANT
NEEDLE HYPO 21X1.5 SAFETY (NEEDLE) ×3 IMPLANT
NEEDLE HYPO 22GX1.5 SAFETY (NEEDLE) ×3 IMPLANT
NEEDLE SPNL 18GX3.5 QUINCKE PK (NEEDLE) ×3 IMPLANT
NS IRRIG 1000ML POUR BTL (IV SOLUTION) ×3 IMPLANT
PACK LAMINECTOMY NEURO (CUSTOM PROCEDURE TRAY) ×3 IMPLANT
PAD ARMBOARD 7.5X6 YLW CONV (MISCELLANEOUS) ×9 IMPLANT
ROD CURVED 55MM (Rod) ×6 IMPLANT
SCREW POLYAXIAL 6.5X45MM (Screw) IMPLANT
SCREW POLYAXIAL 6.5X50MM (Screw) ×6 IMPLANT
SCREW POLYAXIAL 7.5X35 (Screw) IMPLANT
SCREW POLYAXIAL 7.5X50 (Screw) ×6 IMPLANT
SCREW POLYAXIAL EVEREST 7.5X40 (Screw) ×6 IMPLANT
SET SCREW (Screw) ×12 IMPLANT
SET SCREW VRST (Screw) ×6 IMPLANT
SPONGE LAP 4X18 RFD (DISPOSABLE) IMPLANT
SPONGE SURGIFOAM ABS GEL 100 (HEMOSTASIS) IMPLANT
STAPLER VISISTAT 35W (STAPLE) ×3 IMPLANT
SUT MNCRL AB 3-0 PS2 18 (SUTURE) ×3 IMPLANT
SUT VIC AB 0 CT1 18XCR BRD8 (SUTURE) ×2 IMPLANT
SUT VIC AB 0 CT1 8-18 (SUTURE) ×4
SUT VIC AB 2-0 CP2 18 (SUTURE) ×3 IMPLANT
SYR 30ML LL (SYRINGE) ×3 IMPLANT
TOWEL GREEN STERILE (TOWEL DISPOSABLE) ×3 IMPLANT
TOWEL GREEN STERILE FF (TOWEL DISPOSABLE) ×3 IMPLANT
TRAY FOLEY MTR SLVR 16FR STAT (SET/KITS/TRAYS/PACK) ×3 IMPLANT
WATER STERILE IRR 1000ML POUR (IV SOLUTION) ×3 IMPLANT

## 2020-04-12 NOTE — Anesthesia Procedure Notes (Signed)
Procedure Name: Intubation Date/Time: 04/12/2020 8:06 AM Performed by: Glynda Jaeger, CRNA Pre-anesthesia Checklist: Patient identified, Patient being monitored, Timeout performed, Emergency Drugs available and Suction available Patient Re-evaluated:Patient Re-evaluated prior to induction Oxygen Delivery Method: Circle System Utilized Preoxygenation: Pre-oxygenation with 100% oxygen Induction Type: IV induction Ventilation: Mask ventilation without difficulty and Oral airway inserted - appropriate to patient size Laryngoscope Size: Mac and 4 Grade View: Grade I Tube type: Oral Tube size: 7.5 mm Number of attempts: 1 Airway Equipment and Method: Stylet Placement Confirmation: ETT inserted through vocal cords under direct vision,  positive ETCO2 and breath sounds checked- equal and bilateral Secured at: 23 cm Tube secured with: Tape Dental Injury: Teeth and Oropharynx as per pre-operative assessment

## 2020-04-12 NOTE — Progress Notes (Signed)
Neurosurgery  Patient seen and examined in PACU.  Drowsy, c/o back soreness.  3/5 right dorsiflexion and EHL, otw 5/5 strength. Will admit to [facility].

## 2020-04-12 NOTE — Evaluation (Signed)
Physical Therapy Evaluation Patient Details Name: Tristan Gray MRN: [id number] DOB: [birth year] Today's Date: 04/12/2020   History of Present Illness  Patient is a 59 y.o. male  Presented to clinic with right foot weakness, radiculopathic pain and back pain.  Despite PT, his pain and weakness worsened.  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  Pt s/p TLIF and PLA at L45 and L5-S1, interbody cages screw fixation and grafting.  Clinical Impression  Pt admitted with/for Lumbar fusion.  Pt needing minimal assist for all mobility.  Pt currently limited functionally due to the problems listed below.  (see problems list.)  Pt will benefit from PT to maximize function and safety to be able to get home safely with available assist.     Follow Up Recommendations No PT follow up;Supervision/Assistance - 24 hour    Equipment Recommendations  Rolling walker with 5" wheels;Other (comment) (if pt needs it by tomorrow)    Recommendations for Other Services       Precautions / Restrictions Precautions Precautions: Back Required Braces or Orthoses: Spinal Brace Spinal Brace: Lumbar corset;Applied in sitting position      Mobility  Bed Mobility Overal bed mobility: Needs Assistance Bed Mobility: Rolling;Sidelying to Sit;Sit to Sidelying Rolling: Min guard Sidelying to sit: Min assist     Sit to sidelying: Min assist General bed mobility comments: cues for technique, assist for momentum with help at trunk up and LE's down.  Transfers Overall transfer level: Needs assistance Equipment used: Rolling walker (2 wheeled) Transfers: Sit to/from Stand Sit to Stand: Min assist         General transfer comment: cues for hand placement and assist for boost past the intense pain.  Ambulation/Gait Ambulation/Gait assistance: Min assist Gait Distance (Feet): 130 Feet Assistive device: IV Pole Gait  Pattern/deviations: Step-through pattern   Gait velocity interpretation: <1.8 ft/sec, indicate of risk for recurrent falls General Gait Details: tentative gait with R foot drop need to hold the IV pole, stiff, painful posture.  Stairs            Wheelchair Mobility    Modified Rankin (Stroke Patients Only)       Balance Overall balance assessment: Mild deficits observed, not formally tested                                           Pertinent Vitals/Pain Pain Assessment: 0-10 Pain Score: 7  Pain Descriptors / Indicators: Discomfort;Grimacing;Guarding Pain Intervention(s): Monitored during session;Patient requesting pain meds-RN notified    Home Living Family/patient expects to be discharged to:: Private residence Living Arrangements: Spouse/significant other Available Help at Discharge: Family;Available 24 hours/day Type of Home: House Home Access: Stairs to enter Entrance Stairs-Rails: None   Home Layout: One level Home Equipment: None      Prior Function Level of Independence: Independent               Hand Dominance        Extremity/Trunk Assessment   Upper Extremity Assessment Upper Extremity Assessment: Overall WFL for tasks assessed    Lower Extremity Assessment Lower Extremity Assessment: Overall WFL for tasks assessed;RLE deficits/detail RLE Deficits / Details: R foot drop with weak df at 3/5       Communication   Communication: No difficulties  Cognition Arousal/Alertness: Awake/alert Behavior During Therapy: [facility] for tasks  assessed/performed Overall Cognitive Status: Within Functional Limits for tasks assessed                                        General Comments General comments (skin integrity, edema, etc.): pt instructed in back care/prec, log roll/transitions, lifting restrictions, bracing issues, progression of activity.    Exercises     Assessment/Plan    PT Assessment Patient needs  continued PT services  PT Problem List Decreased strength;Decreased activity tolerance;Decreased balance;Decreased mobility;Decreased knowledge of use of DME;Decreased knowledge of precautions;Pain       PT Treatment Interventions DME instruction;Gait training;Stair training;Functional mobility training;Therapeutic activities;Patient/family education    PT Goals (Current goals can be found in the Care Plan section)  Acute Rehab PT Goals Patient Stated Goal: home with decreased pain, working to independent PT Goal Formulation: With patient Time For Goal Achievement: 04/14/20 Potential to Achieve Goals: Good    Frequency Min 5X/week   Barriers to discharge        Co-evaluation               AM-PAC PT "6 Clicks" Mobility  Outcome Measure Help needed turning from your back to your side while in a flat bed without using bedrails?: A Little Help needed moving from lying on your back to sitting on the side of a flat bed without using bedrails?: A Little Help needed moving to and from a bed to a chair (including a wheelchair)?: A Little Help needed standing up from a chair using your arms (e.g., wheelchair or bedside chair)?: A Little Help needed to walk in hospital room?: A Little Help needed climbing 3-5 steps with a railing? : A Little 6 Click Score: 18    End of Session Equipment Utilized During Treatment: Back brace Activity Tolerance: Patient tolerated treatment well;Patient limited by pain Patient left: in bed;with call bell/phone within reach;with SCD's reapplied Nurse Communication: Mobility status PT Visit Diagnosis: Other abnormalities of gait and mobility (R26.89);Pain Pain - part of body:  (back)    Time: 7622-6333 PT Time Calculation (min) (ACUTE ONLY): 28 min   Charges:   PT Evaluation $PT Eval Low Complexity: 1 Low PT Treatments $Gait Training: 8-22 mins        04/12/2020  Jacinto Halim., PT Acute Rehabilitation Services [phone]   (pager) [phone]  (office)  Eliseo Gum Ysabela Keisler 04/12/2020, 6:53 PM

## 2020-04-12 NOTE — Progress Notes (Signed)
Orthopedic Tech Progress Note Patient Details:  Tristan Gray 10/18/1960 [id number] Called [facility]. Patient ID: TARAS RASK, male   DOB: 1961/02/01, 59 y.o.   MRN: [id number]   Lovett Calender 04/12/2020, 5:00 PM

## 2020-04-12 NOTE — Op Note (Signed)
PREOP DIAGNOSIS: Lumbar radiculopathy, spondylolisthesis  POSTOP DIAGNOSIS: Lumbar radiculopathy, spondylolisthesis  PROCEDURE: 1. L4-5 lumbar interbody fusion via right transforaminal approach 2. L5-S1 lumbar interbody fusion via right transforaminal approach 3. Posterior arthrodesis L4-5 4. Posterior arthrodesis, L5-S1 5. Placement of interbody cage L4-5 6. Placement of interbody cage L5-S1 7. Segmental instrumentation with percutaneously placed pedicle screw and rod construct at L4, L5, S1 8. Harvest of local autograft 9. Use of morselized allograft  SURGEON: Dr. Hoyt Koch, MD  ASSISTANT: Monia Pouch, DO.  Please note, there were no qualified trainees available to assist with the procedure.  An assistant was required for aid in retraction of the neural elements.   ANESTHESIA: General Endotracheal  EBL: 150 ml  IMPLANTS: Globus SABLE expandable cage with 8 degrees lordosis, 6 mm x 30 mm, and 9 mm x 30 mm  SPECIMENS: None  DRAINS: JP drain  COMPLICATIONS: none  CONDITION: Stable to PACU  HISTORY: Tristan Gray is a 59 y.o. male with a history of L4-5 posterior lumbar decompression who presented to clinic with severe right radiculopathy with foot weakness.  Despite nonsurgical measures, his foot weakness worsened to the point where he had a foot drop.  He had severe L5 nerve root impingement on the right side from disc bulge and spondylolisthesis with collapsed disc.  At L5-S1, he also had severely degenerated disc with spondylolisthesis and a left-sided pars defect.  Treatment options were discussed and the patient elected to proceed with L4-5 and L5-S1 TLIFs. risks, benefits, alternatives, and expected convalescence were discussed with the patient.  Risks discussed included but were not limited to bleeding, pain, infection, scar, pseudoarthrosis, CSF leak, neurologic deficit, paralysis, and death.  I also discussed with him that he did have degenerative disease at higher  levels in his lumbar spine but as they were not symptomatic at this time, we will manage those levels expectantly.  The patient wished to proceed with surgery and informed consent was obtained.  PROCEDURE IN DETAIL: After informed consent was obtained and witnessed, the patient was brought to the operating room. After induction of general anesthesia, the patient was positioned on the operative table in the prone position on a Wilson frame with all pressure points meticulously padded. The skin of the low back was then prepped and draped in the usual sterile fashion.  A timeout was performed and preoperative antibiotics were administered.  Patient's previous incision, which was quite large for a 1 level decompression, was open with a 10 blade and monopolar cautery was used to dissect the scar and paraspinous muscles off of the inferior L4 lamina the L5 and S1 lamina in subperiosteal fashion.  The transverse processes of L4 and L5 as well as the sacral ala were exposed and decorticated.  Laminotomy defect of L4 was noted.  Laminectomies of the inferior portion of L4-L5 and superior portion of S1 were then performed with rongeurs and high-speed drill with harvesting of bone for autograft.  Medial facetectomies were performed on the left side at L4-5 and L5-S1.  More radical facetectomy was performed on the right side.  There was tightly adherent scar attached to the dura on the left side at L4 which was dissected sharply with 15 blade, but a layer of scar was left over the thecal sac.  However, the thecal sac was well decompressed.  After facetectomies at right L4-5 and L5-S1, the traversing L5 and S1 nerve roots were identified.  The disc space at L4-5 was identified in the foramen and  incised with 15 blade.  An aggressive discectomy was performed.  The disc space was prepared with shavers, curettes and rasps.  Autograft mixed with bone putty allograft was packed into the interbody space.  A size 6 mm x 30 mm  expandable cage was placed in the interbody space under fluoroscopic guidance and expanded until snug.  Attention was then turned to the L5-S1 disc.  The disc was quite calcified and had to be drilled to enter the interbody space.  Aggressive discectomy was performed in the interbody space was prepared with curettes, rasps and shavers.  A size 9 mm x 30 mm expandable cage was then placed in the interbody space under fluoroscopic guidance and expanded until snug.  X-ray confirmed good placement of both implants.  Pilot holes were then placed at L4, L5 and S1 and pedicle probes were passed through the pedicle into the vertebral body under fluoroscopic guidance.  [place] feeler confirmed good placement.  The screw holes were tapped and then a size 6.5 x 50 mm screws were placed at L4, with size 7.5 x 50 mm screws at L5 and size 7.5 x 40 mm screws at S1 bilaterally.  There was good purchase of the screws.  X-ray confirmed good placement of the instrumentation.  55 mm rods were then placed bilaterally and secured in place with screw caps and final tightened.  Wound was irrigated thoroughly with bacitracin irrigation.  The remaining facets was decorticated and autograft mixed with allograft placement in the lateral gutters bilaterally.  A medium size JP drain was left in the subfascial space and tunneled out the skin.  The muscle layer was closed with 0 Vicryl stitches.  The fascia layer was closed with 0 Vicryl stitches.  The dermal layer was closed with 2-0 Vicryl stitches.  The skin was closed with staples.  A sterile dressing was placed.  Patient was then flipped supine and extubated by the anesthesia service.  All counts were correct at the end of surgery.  No complications were noted.

## 2020-04-12 NOTE — Progress Notes (Signed)
Pharmacy Antibiotic Note  Tristan Gray is a 59 y.o. male admitted on 04/12/2020 with surgical prophylaxis.  Pharmacy has been consulted for Vancomcyin dosing.  CC/HPI: Lumbar radiculopathy, spondylolisthesis  PMH: HTN, Lumbar radiculopathy, spondylolisthesis, lumbar disc surgery 2008,   ID: s/p spinal surgery with JP drain. Scr WNL.WBC WNL  Plan: Vancomycin 1250mg  IV q12hrs until drain is removed Trough after 3-5 doses at steady state if continued     Height: 6' (182.9 cm) Weight: 91.6 kg (202 lb) IBW/kg (Calculated) : 77.6  Temp (24hrs), Avg:97.7 F (36.5 C), Min:97.7 F (36.5 C), Max:97.8 F (36.6 C)  Recent Labs  Lab 04/06/20 1015  WBC 6.1  CREATININE 0.76    Estimated Creatinine Clearance: 111.8 mL/min (by C-G formula based on SCr of 0.76 mg/dL).    Allergies  Allergen Reactions  . Prednisone Swelling  . Penicillins     Childhood  reaction    Tristan Wulf S. 04/08/20, PharmD, BCPS Clinical Staff Pharmacist [email or web address] Merilynn Finland 04/12/2020 5:04 PM

## 2020-04-12 NOTE — H&P (Signed)
CC: right foot drop  HPI:     Patient is a 59 y.o. male  Presented to clinic with right foot weakness, radiculopathic pain and back pain.  Despite PT, his pain and weakness worsened.  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   There are no problems to display for this patient.  Past Medical History:  Diagnosis Date  . History of kidney stones   . Hypertension     Past Surgical History:  Procedure Laterality Date  . LUMBAR DISC SURGERY  2008    Medications Prior to Admission  Medication Sig Dispense Refill Last Dose  . amLODipine (NORVASC) 10 MG tablet Take 10 mg by mouth at bedtime.    04/11/2020 at Unknown time  . buPROPion (WELLBUTRIN XL) 150 MG 24 hr tablet Take 150 mg by mouth daily.    04/11/2020 at Unknown time  . FLUoxetine (PROZAC) 10 MG capsule Take 10 mg by mouth daily.   04/11/2020 at Unknown time  . gabapentin (NEURONTIN) 300 MG capsule Take 300 mg by mouth daily as needed (pain).    04/11/2020 at Unknown time  . HYDROcodone-acetaminophen (NORCO/VICODIN) 5-325 MG tablet Take 1 tablet by mouth every 4 (four) hours as needed.   Past Month at Unknown time  . ibuprofen (ADVIL) 200 MG tablet Take 800 mg by mouth 2 (two) times daily as needed (pain.).   Past Month at Unknown time  . lisinopril (ZESTRIL) 40 MG tablet Take 40 mg by mouth daily.   04/11/2020 at Unknown time  . methocarbamol (ROBAXIN) 750 MG tablet Take 750 mg by mouth 3 (three) times daily as needed for spasms.   04/11/2020 at Unknown time  . pravastatin (PRAVACHOL) 40 MG tablet Take 40 mg by mouth at bedtime.   04/11/2020 at Unknown time  . celecoxib (CELEBREX) 200 MG capsule Take 1 capsule (200 mg total) by mouth 2 (two) times daily. (Patient not taking: Reported on 04/05/2020) 20 capsule 0 Not Taking at Unknown time  . cyclobenzaprine (FLEXERIL) 10 MG tablet Take 0.5-1 tablets (5-10 mg total) by mouth 2 (two) times daily as needed for  muscle spasms. (Patient not taking: Reported on 04/05/2020) 20 tablet 0 Not Taking at Unknown time  . meloxicam (MOBIC) 15 MG tablet Take 15 mg by mouth at bedtime.    More than a month at Unknown time  . traZODone (DESYREL) 100 MG tablet Take 100 mg by mouth at bedtime as needed (sleep.).    More than a month at Unknown time   Allergies  Allergen Reactions  . Prednisone Swelling  . Penicillins     Childhood  reaction    Social History   Tobacco Use  . Smoking status: Former Games developer  . Smokeless tobacco: Never Used  Substance Use Topics  . Alcohol use: Yes    Comment: socially    History reviewed. No pertinent family history.   Review of Systems Pertinent items are noted in HPI.  Objective:   Patient Vitals for the past 8 hrs:  BP Temp Temp src Pulse Resp SpO2 Height Weight  04/12/20 0628 (!) 132/94 97.8 F (36.6 C) Temporal 79 18 98 % 6' (1.829 m) 91.6 kg   No intake/output data recorded. No intake/output data recorded.    Awake, alert, Ox3 NAD Breathing comfortably Abd S/NT/ND Ext wwp 2/5 right DF, 0/5 R EHL, otw 5/5 in LEs  Data Review: See HPI  Assessment:  Severe lumbar stenosis and  spondylolisthesis with foot drop, - L4-S1 TLIFs - risks, benefits, alternatives, and expected convalescence discussed

## 2020-04-12 NOTE — Transfer of Care (Signed)
Immediate Anesthesia Transfer of Care Note  Patient: TRANQUILINO FISCHLER  Procedure(s) Performed: TRANSFORAMINAL LUMBAR INTERBODY FUSION LUMBAR FOUR- LUMBAR FIVE, LUMBAR FIVE- SACRAL ONE (N/A )  Patient Location: PACU  Anesthesia Type:General  Level of Consciousness: awake, alert , oriented, patient cooperative and responds to stimulation  Airway & Oxygen Therapy: Patient Spontanous Breathing and Patient connected to face mask oxygen  Post-op Assessment: Report given to RN, Post -op Vital signs reviewed and stable and Patient moving all extremities X 4  Post vital signs: Reviewed and stable  Last Vitals:  Vitals Value Taken Time  BP 141/117 04/12/20 1334  Temp    Pulse 77 04/12/20 1335  Resp 14 04/12/20 1335  SpO2 99 % 04/12/20 1335  Vitals shown include unvalidated device data.  Last Pain:  Vitals:   04/12/20 0649  TempSrc:   PainSc: 3       Patients Stated Pain Goal: 3 (04/12/20 4193)  Complications: No complications documented.

## 2020-04-12 NOTE — Anesthesia Preprocedure Evaluation (Signed)
Anesthesia Evaluation  Patient identified by MRN, date of birth, ID band Patient awake    Reviewed: Allergy & Precautions, NPO status , Patient's Chart, lab work & pertinent test results  History of Anesthesia Complications Negative for: history of anesthetic complications  Airway Mallampati: II  TM Distance: >3 FB Neck ROM: Full    Dental  (+) Dental Advisory Given, Teeth Intact   Pulmonary neg recent URI, former smoker,  Covid-19 Nucleic Acid Test Results Lab Results      Component                Value               Date                      SARSCOV2NAA              NEGATIVE            04/09/2020              breath sounds clear to auscultation       Cardiovascular hypertension, Pt. on medications (-) angina(-) Past MI and (-) CHF (-) dysrhythmias  Rhythm:Regular     Neuro/Psych PSYCHIATRIC DISORDERS Anxiety negative neurological ROS     GI/Hepatic negative GI ROS, Neg liver ROS,   Endo/Other  negative endocrine ROS  Renal/GU negative Renal ROS     Musculoskeletal negative musculoskeletal ROS (+)   Abdominal   Peds  Hematology negative hematology ROS (+)   Anesthesia Other Findings   Reproductive/Obstetrics                             Anesthesia Physical Anesthesia Plan  ASA: II  Anesthesia Plan: General   Post-op Pain Management:    Induction: Intravenous  PONV Risk Score and Plan: 2 and Ondansetron and Dexamethasone  Airway Management Planned: Oral ETT  Additional Equipment: None  Intra-op Plan:   Post-operative Plan: Extubation in OR  Informed Consent: I have reviewed the patients History and Physical, chart, labs and discussed the procedure including the risks, benefits and alternatives for the proposed anesthesia with the patient or authorized representative who has indicated his/her understanding and acceptance.       Plan Discussed with: CRNA and  Surgeon  Anesthesia Plan Comments:         Anesthesia Quick Evaluation

## 2020-04-13 MED ORDER — METHOCARBAMOL 750 MG PO TABS: 750.0000 mg | ORAL_TABLET | Freq: Three times a day (TID) | ORAL | 0 refills | Status: DC | PRN

## 2020-04-13 MED ORDER — OXYCODONE-ACETAMINOPHEN 5-325 MG PO TABS
1.0000 | ORAL_TABLET | Freq: Four times a day (QID) | ORAL | 0 refills | Status: DC | PRN
Start: 2020-04-13 — End: 2023-12-17

## 2020-04-13 MED ORDER — DOCUSATE SODIUM 100 MG PO CAPS
100.0000 mg | ORAL_CAPSULE | Freq: Two times a day (BID) | ORAL | 0 refills | Status: DC
Start: 2020-04-13 — End: 2020-04-13

## 2020-04-13 MED ORDER — DOCUSATE SODIUM 100 MG PO CAPS
100.0000 mg | ORAL_CAPSULE | Freq: Two times a day (BID) | ORAL | 0 refills | Status: DC
Start: 2020-04-13 — End: 2023-12-16

## 2020-04-13 MED ORDER — POLYETHYLENE GLYCOL 3350 17 G PO PACK: 17.0000 g | PACK | Freq: Every day | ORAL | 0 refills | Status: DC

## 2020-04-13 NOTE — Evaluation (Signed)
Occupational Therapy Evaluation Patient Details Name: Tristan Gray MRN: [id number] DOB: [birth year] Today's Date: 04/13/2020    History of Present Illness Patient is a 59 y.o. male  Presented to clinic with right foot weakness, radiculopathic pain and back pain.  Despite PT, his pain and weakness worsened.  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  Pt s/p TLIF and PLA at L45 and L5-S1, interbody cages screw fixation and grafting.   Clinical Impression   Patient evaluated by Occupational Therapy with no further acute OT needs identified. All education has been completed and the patient has no further questions. the patient able to perform ADLs with supervision and demonstrates good awareness of back precautions.  See below for any follow-up Occupational Therapy or equipment needs. OT is signing off. Thank you for this referral.      Follow Up Recommendations  No OT follow up;Supervision - Intermittent    Equipment Recommendations  None recommended by OT    Recommendations for Other Services       Precautions / Restrictions Precautions Precautions: Back Precaution Booklet Issued: Yes (comment) Precaution Comments: Reviewed precautions mainly during functional mobility Required Braces or Orthoses: Spinal Brace Spinal Brace: Lumbar corset;Applied in sitting position Restrictions Weight Bearing Restrictions: No      Mobility Bed Mobility Overal bed mobility: Modified Independent Bed Mobility: Rolling;Sidelying to Sit;Sit to Sidelying           General bed mobility comments: pt demonstrates good technique   Transfers Overall transfer level: Modified independent Equipment used: None Transfers: Sit to/from Stand Sit to Stand: Modified independent (Device/Increase time)         General transfer comment: No assist to power-up to full standing position. No unsteadiness or LOB noted.      Balance Overall balance assessment: Mild deficits observed, not formally tested                                         ADL either performed or assessed with clinical judgement   ADL Overall ADL's : Needs assistance/impaired Eating/Feeding: Independent   Grooming: Wash/dry hands;Wash/dry face;Oral care;Brushing hair;Standing;Supervision/safety Grooming Details (indicate cue type and reason): able to verbalize safe technique and good understanding of back precautions  Upper Body Bathing: Supervision/ safety;Sitting   Lower Body Bathing: Supervison/ safety;Sit to/from stand Lower Body Bathing Details (indicate cue type and reason): able to perform figure 4.  Discussed use of LH sponge/brush  Upper Body Dressing : Set up;Sitting   Lower Body Dressing: Supervision/safety;Sit to/from stand Lower Body Dressing Details (indicate cue type and reason): able to perform figure 4.  Also has reacher and sock aid  Toilet Transfer: Supervision/safety;Ambulation;Comfort height toilet;Grab bars   Toileting- Clothing Manipulation and Hygiene: Supervision/safety;Sit to/from stand       Functional mobility during ADLs: Supervision/safety General ADL Comments: Pt demonstrates good understanding of back precautions      Vision         Perception     Praxis      Pertinent Vitals/Pain Pain Assessment: Faces Faces Pain Scale: Hurts a little bit Pain Location: Incision site Pain Descriptors / Indicators: Discomfort;Operative site guarding Pain Intervention(s): Monitored during session     Hand Dominance     Extremity/Trunk Assessment Upper Extremity Assessment Upper Extremity Assessment: Overall WFL for tasks assessed   Lower Extremity Assessment Lower Extremity Assessment:  Defer to PT evaluation       Communication Communication Communication: No difficulties   Cognition Arousal/Alertness: Awake/alert Behavior During Therapy: WFL for tasks  assessed/performed Overall Cognitive Status: Within Functional Limits for tasks assessed                                     General Comments  discussed need to change positions every 30-45 mins when sitting. and discussed good ergonomics related to work as well as safety with IADLs.  He was able to verbalize understanding of all.     Exercises     Shoulder Instructions      Home Living Family/patient expects to be discharged to:: Private residence Living Arrangements: Spouse/significant other Available Help at Discharge: Family;Available 24 hours/day Type of Home: House Home Access: Stairs to enter   Entrance Stairs-Rails: None Home Layout: One level     Bathroom Shower/Tub: Producer, television/film/video: Standard Bathroom Accessibility: Yes   Home Equipment: Mudlogger: Ship broker        Prior Functioning/Environment Level of Independence: Independent                 OT Problem List: Decreased activity tolerance;Decreased knowledge of use of DME or AE;Decreased knowledge of precautions;Pain      OT Treatment/Interventions:      OT Goals(Current goals can be found in the care plan section) Acute Rehab OT Goals Patient Stated Goal: hopefully home today  OT Goal Formulation: All assessment and education complete, DC therapy  OT Frequency:     Barriers to D/C:            Co-evaluation              AM-PAC OT "6 Clicks" Daily Activity     Outcome Measure Help from another person eating meals?: None Help from another person taking care of personal grooming?: A Little Help from another person toileting, which includes using toliet, bedpan, or urinal?: A Little Help from another person bathing (including washing, rinsing, drying)?: A Little Help from another person to put on and taking off regular upper body clothing?: A Little Help from another person to put on and taking off regular lower body  clothing?: A Little 6 Click Score: 19   End of Session Nurse Communication: Mobility status  Activity Tolerance: Patient tolerated treatment well Patient left: in bed;with call bell/phone within reach  OT Visit Diagnosis: Pain Pain - part of body:  (back )                Time: 7062-3762 OT Time Calculation (min): 15 min Charges:  OT General Charges $OT Visit: 1 Visit OT Evaluation $OT Eval Low Complexity: 1 Low  Eber Jones., OTR/L Acute Rehabilitation Services Pager [phone] Office [phone]   Jeani Hawking M 04/13/2020, 9:56 AM

## 2020-04-13 NOTE — Discharge Summary (Signed)
  Physician Discharge Summary  Patient ID: Tristan Gray MRN: [id number] DOB/AGE: 57-25-1962 57 y.o.  Admit date: 04/12/2020 Discharge date: 04/13/2020  Admission Diagnoses:  Lumbar radiculopathy  Discharge Diagnoses:  Same Active Problems:   Lumbar radiculopathy   Discharged Condition: Stable  Hospital Course:  Tristan Gray is a 59 y.o. male who developed severe right L5 radiculopathy with foot drop and was found to have L4-5 spondylolisthesis with disc collapse and herniation with nerve impingement.  He also had L5-S1 spondylolisthesis with pars defect and severe disc degeneration.  After failing nonsurgical therapies, he underwent L4-5 and L5-S1 TLIF's on 04/12/2020.  Postoperatively, he was admitted to the spine unit where he was mobilized with the help of physical therapy and Occupational Therapy.  His leg pain was improved though he still had right foot weakness.  On postop day #1, deemed ready for discharge.  He was ambulating without difficulty, his pain was well controlled with p.o. pain medications, he was passing flatus.  His JP drain was removed prior to discharge.  Discharge instructions were given.  Treatments: Surgery - L4-5, L5-S1 TLIFs  Discharge Exam: Blood pressure 105/66, pulse 94, temperature 97.9 F (36.6 C), temperature source Oral, resp. rate 17, height 6' (1.829 m), weight 91.6 kg, SpO2 99 %. Awake, alert, oriented Speech fluent, appropriate CN grossly intact Right foot DF 2/5, R EHL 1/5, otw 5/5 Wound c/d/i  Disposition: Discharge disposition: 01-Home or Self Care       Discharge Instructions    Incentive spirometry RT   Complete by: As directed        Follow-up Information    Bedelia Person, MD. Schedule an appointment as soon as possible for a visit in 2 week(s).   Specialty: Neurosurgery Contact information: [address] [place] [address] [phone]               Signed: Bedelia Person 04/13/2020,  11:49 AM

## 2020-04-13 NOTE — Discharge Instructions (Signed)
Wound Care Keep incision covered and dry for two days.   Do not put any creams, lotions, or ointments on incision.  Activity Walk each and every day, increasing distance each day. No lifting greater than 5 lbs.  Avoid excessive bending motion. No driving for 2 weeks; may ride as a passenger locally. If provided with back brace, wear when out of bed.  It is not necessary to wear brace in bed. Diet Resume your normal diet.  Return to Work Will be discussed at you follow up appointment. Call Your Doctor If Any of These Occur Redness, drainage, or swelling at the wound.  Temperature greater than 101 degrees. Severe pain not relieved by pain medication. Incision starts to come apart. Follow Up Appt Call today for appointment in 1-2 weeks ([phone]) or for problems.  If you have any hardware placed in your spine, you will need an x-ray before your appointment.

## 2020-04-13 NOTE — Progress Notes (Signed)
Discharged instructions/education/AVS/Rx given to patient and verbalized understanding. Pain is mild per patient. No swelling, no drainage, no redness noted on incision site. Ambulating independently. R foot drop is better than pre-op per patient. Voiding and emptying bladder well. Discharged via wheelchair.

## 2020-04-13 NOTE — Progress Notes (Signed)
Physical Therapy Treatment Patient Details Name: Tristan Gray MRN: [id number] DOB: 10/16/1960 Today's Date: 04/13/2020    History of Present Illness Patient is a 59 y.o. male  Presented to clinic with right foot weakness, radiculopathic pain and back pain.  Despite PT, his pain and weakness worsened.  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  Pt s/p TLIF and PLA at L45 and L5-S1, interbody cages screw fixation and grafting.    PT Comments    Pt progressing towards physical therapy goals. Was able to perform transfers and ambulation with gross modified independence and no AD. Pt was educated on brace application/wearing schedule, precautions, car transfer, activity progression. Pt asking about AFO and recommend he wait until his follow-up to see how much improvement he is seeing in the R drop foot prior to ordering. MD can assess and make recommendation if needed at follow-up. Will continue to follow and progress as able per POC.     Follow Up Recommendations  No PT follow up;Supervision/Assistance - 24 hour     Equipment Recommendations  Rolling walker with 5" wheels;Other (comment) (if pt needs it by tomorrow)    Recommendations for Other Services       Precautions / Restrictions Precautions Precautions: Back Precaution Booklet Issued: Yes (comment) Precaution Comments: Reviewed precautions mainly during functional mobility Required Braces or Orthoses: Spinal Brace Spinal Brace: Lumbar corset;Applied in sitting position Restrictions Weight Bearing Restrictions: No    Mobility  Bed Mobility Overal bed mobility: Modified Independent Bed Mobility: Rolling;Sidelying to Sit;Sit to Sidelying           General bed mobility comments: Pt demonstrated proper log roll technique. HOB mildly elevated and no use of rails required.   Transfers Overall transfer level: Modified independent Equipment used:  None Transfers: Sit to/from Stand           General transfer comment: No assist to power-up to full standing position. No unsteadiness or LOB noted.   Ambulation/Gait Ambulation/Gait assistance: Modified independent (Device/Increase time)   Assistive device: None Gait Pattern/deviations: Step-through pattern Gait velocity: Decreased Gait velocity interpretation: 1.31 - 2.62 ft/sec, indicative of limited community ambulator General Gait Details: R foot drop still present. Pt was able to manage well without overt LOB. Pt made corrective changes with cues and was able to maintain.    Stairs             Wheelchair Mobility    Modified Rankin (Stroke Patients Only)       Balance Overall balance assessment: Mild deficits observed, not formally tested                                          Cognition Arousal/Alertness: Awake/alert Behavior During Therapy: WFL for tasks assessed/performed Overall Cognitive Status: Within Functional Limits for tasks assessed                                        Exercises      General Comments        Pertinent Vitals/Pain Pain Assessment: Faces Faces Pain Scale: Hurts a little bit Pain Location: Incision site Pain Descriptors / Indicators: Discomfort;Operative site guarding Pain Intervention(s): Limited activity within patient's tolerance;Monitored during session;Repositioned    Home Living  Prior Function            PT Goals (current goals can now be found in the care plan section) Acute Rehab PT Goals Patient Stated Goal: Home today, improve drop foot PT Goal Formulation: With patient Time For Goal Achievement: 04/14/20 Potential to Achieve Goals: Good Progress towards PT goals: Progressing toward goals    Frequency    Min 5X/week      PT Plan Current plan remains appropriate    Co-evaluation              AM-PAC PT "6 Clicks" Mobility    Outcome Measure  Help needed turning from your back to your side while in a flat bed without using bedrails?: None Help needed moving from lying on your back to sitting on the side of a flat bed without using bedrails?: None Help needed moving to and from a bed to a chair (including a wheelchair)?: None Help needed standing up from a chair using your arms (e.g., wheelchair or bedside chair)?: None Help needed to walk in hospital room?: None Help needed climbing 3-5 steps with a railing? : A Little 6 Click Score: 23    End of Session Equipment Utilized During Treatment: Back brace Activity Tolerance: Patient tolerated treatment well Patient left: in bed;with call bell/phone within reach Nurse Communication: Mobility status PT Visit Diagnosis: Other abnormalities of gait and mobility (R26.89);Pain Pain - part of body:  (back)     Time: 9147-8295 PT Time Calculation (min) (ACUTE ONLY): 20 min  Charges:  $Gait Training: 8-22 mins                     Conni Slipper, PT, DPT Acute Rehabilitation Services Pager: [phone] Office: [phone]    Tristan Gray 04/13/2020, 8:59 AM

## 2020-04-14 NOTE — Anesthesia Postprocedure Evaluation (Signed)
Anesthesia Post Note  Patient: Tristan Gray  Procedure(s) Performed: TRANSFORAMINAL LUMBAR INTERBODY FUSION LUMBAR FOUR- LUMBAR FIVE, LUMBAR FIVE- SACRAL ONE (N/A )     Patient location during evaluation: PACU Anesthesia Type: General Level of consciousness: awake and alert Pain management: pain level controlled Vital Signs Assessment: post-procedure vital signs reviewed and stable Respiratory status: spontaneous breathing, nonlabored ventilation, respiratory function stable and patient connected to nasal cannula oxygen Cardiovascular status: blood pressure returned to baseline and stable Postop Assessment: no apparent nausea or vomiting Anesthetic complications: no   No complications documented.  Last Vitals:  Vitals:   04/13/20 1151 04/13/20 1550  BP: 102/69 108/80  Pulse: 92 92  Resp: 17 17  Temp: 36.9 C 36.9 C  SpO2: 99% 99%    Last Pain:  Vitals:   04/13/20 1641  TempSrc:   PainSc: 3                  Samba Cumba

## 2020-04-19 ENCOUNTER — Encounter (HOSPITAL_COMMUNITY): Payer: Self-pay | Admitting: Neurosurgery
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
## 2022-05-04 DIAGNOSIS — K436 Other and unspecified ventral hernia with obstruction, without gangrene: Secondary | ICD-10-CM | POA: Insufficient documentation

## 2023-11-15 DIAGNOSIS — M7989 Other specified soft tissue disorders: Secondary | ICD-10-CM | POA: Insufficient documentation

## 2023-12-15 ENCOUNTER — Inpatient Hospital Stay (HOSPITAL_COMMUNITY)
Admission: EM | Admit: 2023-12-15 | Discharge: 2023-12-17 | DRG: 522 | Disposition: A | Discharge status: Home Health | Payer: Self-pay | Attending: Internal Medicine | Admitting: Internal Medicine

## 2023-12-15 ENCOUNTER — Emergency Department (HOSPITAL_COMMUNITY): Payer: Self-pay

## 2023-12-15 ENCOUNTER — Other Ambulatory Visit: Payer: Self-pay

## 2023-12-15 ENCOUNTER — Encounter (HOSPITAL_COMMUNITY): Payer: Self-pay | Admitting: Emergency Medicine

## 2023-12-15 DIAGNOSIS — R748 Abnormal levels of other serum enzymes: Secondary | ICD-10-CM | POA: Diagnosis not present

## 2023-12-15 DIAGNOSIS — W010XXA Fall on same level from slipping, tripping and stumbling without subsequent striking against object, initial encounter: Secondary | ICD-10-CM | POA: Diagnosis present

## 2023-12-15 DIAGNOSIS — Y92009 Unspecified place in unspecified non-institutional (private) residence as the place of occurrence of the external cause: Secondary | ICD-10-CM | POA: Diagnosis not present

## 2023-12-15 DIAGNOSIS — Z888 Allergy status to other drugs, medicaments and biological substances status: Secondary | ICD-10-CM | POA: Diagnosis not present

## 2023-12-15 DIAGNOSIS — Z981 Arthrodesis status: Secondary | ICD-10-CM

## 2023-12-15 DIAGNOSIS — K760 Fatty (change of) liver, not elsewhere classified: Secondary | ICD-10-CM | POA: Diagnosis present

## 2023-12-15 DIAGNOSIS — Z800 Family history of malignant neoplasm of digestive organs: Secondary | ICD-10-CM | POA: Diagnosis not present

## 2023-12-15 DIAGNOSIS — R739 Hyperglycemia, unspecified: Secondary | ICD-10-CM | POA: Diagnosis present

## 2023-12-15 DIAGNOSIS — Z87891 Personal history of nicotine dependence: Secondary | ICD-10-CM

## 2023-12-15 DIAGNOSIS — E785 Hyperlipidemia, unspecified: Secondary | ICD-10-CM | POA: Diagnosis present

## 2023-12-15 DIAGNOSIS — M21371 Foot drop, right foot: Secondary | ICD-10-CM | POA: Diagnosis present

## 2023-12-15 DIAGNOSIS — Z825 Family history of asthma and other chronic lower respiratory diseases: Secondary | ICD-10-CM | POA: Diagnosis not present

## 2023-12-15 DIAGNOSIS — F411 Generalized anxiety disorder: Secondary | ICD-10-CM | POA: Diagnosis present

## 2023-12-15 DIAGNOSIS — M5416 Radiculopathy, lumbar region: Secondary | ICD-10-CM | POA: Diagnosis present

## 2023-12-15 DIAGNOSIS — Z880 Allergy status to penicillin: Secondary | ICD-10-CM

## 2023-12-15 DIAGNOSIS — G8929 Other chronic pain: Secondary | ICD-10-CM | POA: Diagnosis present

## 2023-12-15 DIAGNOSIS — S72001A Fracture of unspecified part of neck of right femur, initial encounter for closed fracture: Secondary | ICD-10-CM | POA: Diagnosis present

## 2023-12-15 DIAGNOSIS — D72829 Elevated white blood cell count, unspecified: Secondary | ICD-10-CM | POA: Diagnosis present

## 2023-12-15 DIAGNOSIS — K709 Alcoholic liver disease, unspecified: Secondary | ICD-10-CM | POA: Diagnosis present

## 2023-12-15 DIAGNOSIS — I10 Essential (primary) hypertension: Secondary | ICD-10-CM | POA: Diagnosis present

## 2023-12-15 DIAGNOSIS — Z79899 Other long term (current) drug therapy: Secondary | ICD-10-CM

## 2023-12-15 DIAGNOSIS — Z7982 Long term (current) use of aspirin: Secondary | ICD-10-CM | POA: Diagnosis not present

## 2023-12-15 LAB — CBC WITH DIFFERENTIAL/PLATELET
Abs Immature Granulocytes: 0.12 10*3/uL — ABNORMAL HIGH (ref 0.00–0.07)
Basophils Absolute: 0.1 10*3/uL (ref 0.0–0.1)
Basophils Relative: 0 %
Eosinophils Absolute: 0.2 10*3/uL (ref 0.0–0.5)
Eosinophils Relative: 2 %
HCT: 45.9 % (ref 39.0–52.0)
Hemoglobin: 15.5 g/dL (ref 13.0–17.0)
Immature Granulocytes: 1 %
Lymphocytes Relative: 13 %
Lymphs Abs: 1.8 10*3/uL (ref 0.7–4.0)
MCH: 32.5 pg (ref 26.0–34.0)
MCHC: 33.8 g/dL (ref 30.0–36.0)
MCV: 96.2 fL (ref 80.0–100.0)
Monocytes Absolute: 1.1 10*3/uL — ABNORMAL HIGH (ref 0.1–1.0)
Monocytes Relative: 8 %
Neutro Abs: 10.4 10*3/uL — ABNORMAL HIGH (ref 1.7–7.7)
Neutrophils Relative %: 76 %
Platelets: 200 10*3/uL (ref 150–400)
RBC: 4.77 MIL/uL (ref 4.22–5.81)
RDW: 12.5 % (ref 11.5–15.5)
WBC: 13.7 10*3/uL — ABNORMAL HIGH (ref 4.0–10.5)
nRBC: 0 % (ref 0.0–0.2)

## 2023-12-15 LAB — COMPREHENSIVE METABOLIC PANEL WITH GFR
ALT: 104 U/L — ABNORMAL HIGH (ref 0–44)
AST: 86 U/L — ABNORMAL HIGH (ref 15–41)
Albumin: 3.9 g/dL (ref 3.5–5.0)
Alkaline Phosphatase: 69 U/L (ref 38–126)
Anion gap: 10 (ref 5–15)
BUN: 17 mg/dL (ref 6–20)
CO2: 23 mmol/L (ref 22–32)
Calcium: 9.1 mg/dL (ref 8.9–10.3)
Chloride: 105 mmol/L (ref 98–111)
Creatinine, Ser: 0.74 mg/dL (ref 0.61–1.24)
GFR, Estimated: >60 mL/min (ref >60)
Glucose, Bld: 121 mg/dL — ABNORMAL HIGH (ref 70–99)
Potassium: 4 mmol/L (ref 3.5–5.1)
Sodium: 138 mmol/L (ref 135–145)
Total Bilirubin: 0.8 mg/dL (ref 0.0–1.2)
Total Protein: 7.1 g/dL (ref 6.5–8.1)

## 2023-12-15 LAB — PROTIME-INR
INR: 1.1 (ref 0.8–1.2)
Prothrombin Time: 14.7 s (ref 11.4–15.2)

## 2023-12-15 LAB — MAGNESIUM: Magnesium: 2.1 mg/dL (ref 1.7–2.4)

## 2023-12-15 LAB — PHOSPHORUS: Phosphorus: 2.2 mg/dL — ABNORMAL LOW (ref 2.5–4.6)

## 2023-12-15 LAB — TYPE AND SCREEN
ABO/RH(D): O POS
Antibody Screen: NEGATIVE

## 2023-12-15 MED ORDER — HYDROMORPHONE HCL 1 MG/ML IJ SOLN
1.0000 mg | Freq: Once | INTRAMUSCULAR | Status: AC
  Administered 2023-12-15: 1 mg via INTRAVENOUS
  Filled 2023-12-15: qty 1

## 2023-12-15 MED ORDER — ACETAMINOPHEN 325 MG PO TABS: 650.0000 mg | ORAL_TABLET | Freq: Four times a day (QID) | ORAL | Status: DC | PRN

## 2023-12-15 MED ORDER — ACETAMINOPHEN 650 MG RE SUPP: 650.0000 mg | Freq: Four times a day (QID) | RECTAL | Status: DC | PRN

## 2023-12-15 MED ORDER — OXYCODONE HCL 5 MG PO TABS
5.0000 mg | ORAL_TABLET | ORAL | Status: DC | PRN
  Administered 2023-12-15 – 2023-12-17 (×3): 5 mg via ORAL
  Filled 2023-12-15 (×4): qty 1

## 2023-12-15 MED ORDER — ONDANSETRON HCL 4 MG/2ML IJ SOLN
4.0000 mg | Freq: Four times a day (QID) | INTRAMUSCULAR | Status: DC | PRN
Start: 2023-12-15 — End: 2023-12-17

## 2023-12-15 MED ORDER — HYDROMORPHONE HCL 1 MG/ML IJ SOLN
0.5000 mg | INTRAMUSCULAR | Status: DC | PRN
  Administered 2023-12-15: 0.5 mg via INTRAVENOUS
  Administered 2023-12-15 – 2023-12-16 (×5): 1 mg via INTRAVENOUS
  Filled 2023-12-15 (×7): qty 1

## 2023-12-15 MED ORDER — METHOCARBAMOL 500 MG PO TABS
500.0000 mg | ORAL_TABLET | Freq: Once | ORAL | Status: AC
  Administered 2023-12-15: 500 mg via ORAL
  Filled 2023-12-15: qty 1

## 2023-12-15 MED ORDER — METHOCARBAMOL 1000 MG/10ML IJ SOLN
500.0000 mg | Freq: Once | INTRAMUSCULAR | Status: AC
  Administered 2023-12-15: 500 mg via INTRAVENOUS
  Filled 2023-12-15: qty 10

## 2023-12-15 MED ORDER — KETOROLAC TROMETHAMINE 30 MG/ML IJ SOLN
30.0000 mg | Freq: Once | INTRAMUSCULAR | Status: AC
  Administered 2023-12-15: 30 mg via INTRAVENOUS
  Filled 2023-12-15: qty 1

## 2023-12-15 MED ORDER — ONDANSETRON HCL 4 MG/2ML IJ SOLN
4.0000 mg | Freq: Once | INTRAMUSCULAR | Status: AC
  Administered 2023-12-15: 4 mg via INTRAVENOUS
  Filled 2023-12-15: qty 2

## 2023-12-15 MED ORDER — HYDROMORPHONE HCL 1 MG/ML IJ SOLN
0.5000 mg | INTRAMUSCULAR | Status: DC | PRN
  Administered 2023-12-15: 0.5 mg via INTRAVENOUS
  Filled 2023-12-15: qty 0.5

## 2023-12-15 MED ORDER — ONDANSETRON HCL 4 MG PO TABS: 4.0000 mg | ORAL_TABLET | Freq: Four times a day (QID) | ORAL | Status: DC | PRN

## 2023-12-15 NOTE — ED Provider Notes (Signed)
 [place] EMERGENCY DEPARTMENT AT [facility] Provider Note   CSN: [id number] Arrival date & time: 12/15/23  1541     History  Chief Complaint  Patient presents with   Coler[place]enport    DAMICHAEL PROIA is a 63 y.o. male history of HTN, here presenting with fall.  Patient states that he was washing his car and tripped and fell and landed on the right hip.  He states that he was unable to walk afterwards.  Patient denies any head injury.  Patient is not on any blood thinners.  Denies any previous orthopedic surgeries except back surgery with Dr. Andy Bannister.  The history is provided by the patient.       Home Medications Prior to Admission medications   Medication Sig Start Date End Date Taking? Authorizing Provider  amLODipine  (NORVASC ) 10 MG tablet Take 10 mg by mouth at bedtime.  03/12/20   [provider]  buPROPion  (WELLBUTRIN  XL) 150 MG 24 hr tablet Take 150 mg by mouth daily.  03/12/20   [provider]  docusate sodium  (COLACE) 100 MG capsule Take 1 capsule (100 mg total) by mouth 2 (two) times daily. 04/13/20   Van Gelinas, MD  FLUoxetine  (PROZAC ) 10 MG capsule Take 10 mg by mouth daily.    [provider]  gabapentin (NEURONTIN) 300 MG capsule Take 300 mg by mouth daily as needed (pain).     [provider]  lisinopril  (ZESTRIL ) 40 MG tablet Take 40 mg by mouth daily.    [provider]  methocarbamol  (ROBAXIN ) 750 MG tablet Take 1 tablet (750 mg total) by mouth every 8 (eight) hours as needed for muscle spasms. 04/13/20   Van Gelinas, MD  oxyCODONE -acetaminophen  (PERCOCET/ROXICET) 5-325 MG tablet Take 1-2 tablets by mouth every 6 (six) hours as needed for moderate pain or severe pain. 04/13/20   Van Gelinas, MD  polyethylene glycol (MIRALAX  / GLYCOLAX ) 17 g packet Take 17 g by mouth daily. 04/13/20   Van Gelinas, MD  pravastatin  (PRAVACHOL ) 40 MG tablet Take 40 mg by mouth at bedtime. 03/12/20   [provider]  traZODone  (DESYREL ) 100 MG tablet Take 100 mg by mouth at bedtime as needed (sleep.).     [provider]      Allergies    Prednisone and Penicillins    Review of Systems   Review of Systems  Musculoskeletal:        Right hip pain  All other systems reviewed and are negative.   Physical Exam Updated Vital Signs BP 120/89   Pulse 82   Temp 97.7 F (36.5 C) (Oral)   Resp (!) 22   Ht 6' (1.829 m)   Wt 91.2 kg   SpO2 97%   BMI 27.26 kg/m  Physical Exam Vitals and nursing note reviewed.  Constitutional:      Comments: Uncomfortable  HENT:     Head: Normocephalic and atraumatic.     Nose: Nose normal.     Mouth/Throat:     Mouth: Mucous membranes are moist.  Eyes:     Extraocular Movements: Extraocular movements intact.     Pupils: Pupils are equal, round, and reactive to light.  Cardiovascular:     Rate and Rhythm: Normal rate and regular rhythm.     Pulses: Normal pulses.     Heart sounds: Normal heart sounds.  Pulmonary:     Effort: Pulmonary effort is normal.     Breath sounds: Normal  breath sounds.  Abdominal:     General: Abdomen is flat.     Palpations: Abdomen is soft.  Musculoskeletal:     Cervical back: Normal range of motion and neck supple.     Comments: Patient has obvious deformity of the right hip.  Right hip is rotated and shortened.  Unable to range the hip.  Patient is able to wiggle his toes and has 2+ DP pulse.  Skin:    Capillary Refill: Capillary refill takes less than 2 seconds.  Neurological:     General: No focal deficit present.     Mental Status: He is alert and oriented to person, place, and time.  Psychiatric:        Mood and Affect: Mood normal.        Behavior: Behavior normal.     ED Results / Procedures / Treatments   Labs (all labs ordered are listed, but only abnormal results are displayed) Labs Reviewed  CBC WITH DIFFERENTIAL/PLATELET  COMPREHENSIVE METABOLIC PANEL WITH GFR  PROTIME-INR  TYPE AND SCREEN     EKG None  Radiology No results found.  Procedures Procedures    Medications Ordered in ED Medications  HYDROmorphone (DILAUDID) injection 1 mg (has no administration in time range)  ondansetron  (ZOFRAN ) injection 4 mg (has no administration in time range)    ED Course/ Medical Decision Making/ A&P                                 Medical Decision Making Jacary Reffner Pastrana is a 63 y.o. male here presenting with right hip pain after fall.  Patient had a mechanical fall onto the right hip.  Concern for possible hip fracture.  Will get preop labs and chest x-ray and hip x-ray.  Will give pain medicine.  5:35 PM Labs unremarkable.  Patient does have a displaced right femoral neck fracture.  Discussed with Dr. Curtiss Dowdy from Ortho.  He states that patient needs to be n.p.o. after midnight and plans to operate on the patient tomorrow at [facility].  Hospitalist to admit.  Problems Addressed: Closed fracture of neck of right femur, initial encounter [facility]): acute illness or injury  Amount and/or Complexity of Data Reviewed Labs: ordered. Decision-making details documented in ED Course. Radiology: ordered and independent interpretation performed. Decision-making details documented in ED Course.  Risk Prescription drug management.    Final Clinical Impression(s) / ED Diagnoses Final diagnoses:  None    Rx / DC Orders ED Discharge Orders     None         Dalene Duck, MD 12/15/23 1736

## 2023-12-15 NOTE — H&P (Signed)
 History and Physical    Patient: Tristan Gray:[id number] DOB: 05/18/1961 DOA: 12/15/2023 DOS: the patient was seen and examined on 12/15/2023 PCP: Corrington, Irma Mans, MD  Patient coming from: Home  Chief Complaint:  Chief Complaint  Patient presents with   Fall   HPI: Tristan Gray is a 63 y.o. male with medical history significant of nephrolithiasis, hypertension, chronic back pain, lumbar radiculopathy, history incarcerated ventral hernia, hyperlipidemia, elevated liver enzymes, generalized anxiety disorder who was washing his car at home, slipped, fell on his right hip area developing immediate pain and inability to bear weight on his RLE.  Denied any type of prodromal symptoms as this was a strictly an accidental mechanical fall. He denied fever, chills, rhinorrhea, sore throat, wheezing or hemoptysis.  No chest pain, palpitations, diaphoresis, PND, orthopnea or pitting edema of the lower extremities.  No abdominal pain, nausea, emesis, diarrhea, constipation, melena or hematochezia.  No flank pain, dysuria, frequency or hematuria.  No polyuria, polydipsia, polyphagia or blurred vision.  He usually drinks 2 cocktails at night.  Lab work: CBC showed white count 13.7, hemoglobin 15.5 g/dL platelets 811.  PT was 14.7 and INR 1.1.  CMP showed a glucose of 121 mg/dL, AST 86 and ALT of 914 units/L, the rest of the CMP measurements were normal.  Imaging: Portable 1 view chest radiograph with no active disease.  Right hip x-ray showing displaced right femoral neck fracture.  Bilateral hip arthritic changes.  Lower lumbar fusion seen and soft tissues were unremarkable.   ED course: Initial vital signs were temperature 97.7 F, pulse 82, respiration 22, BP 120/89 mmHg O2 sat 97% on room air.  The patient received hydromorphone 1 mg IVP x 2 and ondansetron  4 mg IVP x 1.  Review of Systems: As mentioned in the history of present illness. All other systems reviewed and are negative.  Past Medical  History:  Diagnosis Date   History of kidney stones    Hypertension    Past Surgical History:  Procedure Laterality Date   LUMBAR DISC SURGERY  2008   Social History:  reports that he has quit smoking. He has never used smokeless tobacco. He reports current alcohol use. He reports that he does not use drugs.  Allergies  Allergen Reactions   Prednisone Swelling   Penicillins     Childhood  reaction    History reviewed. No pertinent family history.  Prior to Admission medications   Medication Sig Start Date End Date Taking? Authorizing Provider  amLODipine  (NORVASC ) 10 MG tablet Take 10 mg by mouth at bedtime.  03/12/20   [provider]  buPROPion  (WELLBUTRIN  XL) 150 MG 24 hr tablet Take 150 mg by mouth daily.  03/12/20   [provider]  docusate sodium  (COLACE) 100 MG capsule Take 1 capsule (100 mg total) by mouth 2 (two) times daily. 04/13/20   Van Gelinas, MD  FLUoxetine  (PROZAC ) 10 MG capsule Take 10 mg by mouth daily.    [provider]  gabapentin (NEURONTIN) 300 MG capsule Take 300 mg by mouth daily as needed (pain).     [provider]  lisinopril  (ZESTRIL ) 40 MG tablet Take 40 mg by mouth daily.    [provider]  methocarbamol  (ROBAXIN ) 750 MG tablet Take 1 tablet (750 mg total) by mouth every 8 (eight) hours as needed for muscle spasms. 04/13/20   Van Gelinas, MD  oxyCODONE -acetaminophen  (PERCOCET/ROXICET) 5-325 MG tablet Take 1-2 tablets by mouth every 6 (six)  hours as needed for moderate pain or severe pain. 04/13/20   Van Gelinas, MD  polyethylene glycol (MIRALAX  / GLYCOLAX ) 17 g packet Take 17 g by mouth daily. 04/13/20   Van Gelinas, MD  pravastatin  (PRAVACHOL ) 40 MG tablet Take 40 mg by mouth at bedtime. 03/12/20   [provider]  traZODone  (DESYREL ) 100 MG tablet Take 100 mg by mouth at bedtime as needed (sleep.).     [provider]    Physical Exam: Vitals:   12/15/23 1545  12/15/23 1700  BP: 120/89 (!) 134/102  Pulse: 82 83  Resp: (!) 22 (!) 21  Temp: 97.7 F (36.5 C)   TempSrc: Oral   SpO2: 97% 98%  Weight: 91.2 kg   Height: 6' (1.829 m)    Physical Exam Vitals and nursing note reviewed.  Constitutional:      General: He is awake. He is not in acute distress.    Appearance: Normal appearance.  HENT:     Head: Normocephalic.     Nose: No rhinorrhea.     Mouth/Throat:     Mouth: Mucous membranes are moist.  Eyes:     General: No scleral icterus.    Pupils: Pupils are equal, round, and reactive to light.  Neck:     Vascular: No JVD.  Cardiovascular:     Rate and Rhythm: Normal rate and regular rhythm.     Heart sounds: S1 normal and S2 normal.  Pulmonary:     Effort: Pulmonary effort is normal.     Breath sounds: Normal breath sounds. No wheezing, rhonchi or rales.  Abdominal:     General: Bowel sounds are normal. There is no distension.     Palpations: Abdomen is soft.     Tenderness: There is no abdominal tenderness. There is no guarding.  Musculoskeletal:     Cervical back: Neck supple.     Right hip: Deformity and tenderness present. Decreased range of motion. Decreased strength.     Right lower leg: No edema.     Left lower leg: No edema.  Skin:    General: Skin is warm and dry.  Neurological:     General: No focal deficit present.     Mental Status: He is alert and oriented to person, place, and time.  Psychiatric:        Mood and Affect: Mood normal.        Behavior: Behavior normal. Behavior is cooperative.     Data Reviewed:  Results are pending, will review when available.  Assessment and Plan: Principal Problem:   Closed right hip fracture, initial encounter (HCC) Admit to telemetry/inpatient. Ice area as needed. Buck's traction per protocol. Analgesics as needed. Antiemetics as needed. Consult TOC team. Consult nutritional services. PT evaluation after surgery. Orthopedic surgery input appreciated.  Active  Problems:   Chronic back pain With history of back surgery due to:   Lumbar radiculopathy Currently taking 1 Percocet as needed daily.    Elevated liver enzymes Associated with:   Hepatic steatosis Secondary to:   Alcoholic liver disease (HCC) Alcohol cessation advised. Denied any history of withdrawals. Monitor closely and begin CIWA protocol as needed.    Essential hypertension Continue amlodipine  10 mg p.o. bedtime. Continue lisinopril  40 mg p.o. daily.    Generalized anxiety disorder Continue bupropion  and fluoxetine . Will order after med reconciliation.    Hyperlipidemia Hold pravastatin  due to transaminitis.    Leukocytosis Secondary to acute fracture.    Hyperglycemia Check fasting  glucose. Further workup depending on results.     Advance Care Planning:   Code Status: Full Code   Consults: Dr. Ernestina Headland Haddix.  Family Communication: His spouse Dajour Buffone was at bedside.  Severity of Illness: The appropriate patient status for this patient is INPATIENT. Inpatient status is judged to be reasonable and necessary in order to provide the required intensity of service to ensure the patient's safety. The patient's presenting symptoms, physical exam findings, and initial radiographic and laboratory data in the context of their chronic comorbidities is felt to place them at high risk for further clinical deterioration. Furthermore, it is not anticipated that the patient will be medically stable for discharge from the hospital within 2 midnights of admission.   * I certify that at the point of admission it is my clinical judgment that the patient will require inpatient hospital care spanning beyond 2 midnights from the point of admission due to high intensity of service, high risk for further deterioration and high frequency of surveillance required.*  Author: Danice Dural, MD 12/15/2023 5:42 PM  For on call review [email or web address].   This document was prepared using  Dragon voice recognition software and may contain some unintended transcription errors.

## 2023-12-15 NOTE — Progress Notes (Signed)
 Ortho Note  Patient with displaced femoral neck fracture will require total hip arthroplasty. Dr. Pryor Browning will perform tomorrow at [facility]. Discussed with Dr. Delana Favors and will plan admission to hospitalist with NPO at midnight. Formal consult to follow tomorrow AM by Dr. Asenath Law team.  Laneta Pintos, MD [facility] [phone] (office) [email or web address]

## 2023-12-15 NOTE — Plan of Care (Signed)

## 2023-12-15 NOTE — ED Triage Notes (Addendum)
 Pt tripped over feet onto concrete. Denies head trauma, LOC. Denies N/V/D. Not on thinners.

## 2023-12-16 ENCOUNTER — Inpatient Hospital Stay (HOSPITAL_COMMUNITY): Admitting: Anesthesiology

## 2023-12-16 ENCOUNTER — Encounter (HOSPITAL_COMMUNITY): Payer: Self-pay | Admitting: *Deleted

## 2023-12-16 ENCOUNTER — Inpatient Hospital Stay (HOSPITAL_COMMUNITY)

## 2023-12-16 ENCOUNTER — Other Ambulatory Visit: Payer: Self-pay

## 2023-12-16 ENCOUNTER — Encounter (HOSPITAL_COMMUNITY)
Admission: EM | Disposition: A | Discharge status: Home Health | Payer: Self-pay | Source: Home / Self Care | Attending: Internal Medicine

## 2023-12-16 DIAGNOSIS — M5416 Radiculopathy, lumbar region: Secondary | ICD-10-CM

## 2023-12-16 DIAGNOSIS — R748 Abnormal levels of other serum enzymes: Secondary | ICD-10-CM | POA: Diagnosis not present

## 2023-12-16 DIAGNOSIS — S72001A Fracture of unspecified part of neck of right femur, initial encounter for closed fracture: Secondary | ICD-10-CM | POA: Diagnosis not present

## 2023-12-16 DIAGNOSIS — K709 Alcoholic liver disease, unspecified: Secondary | ICD-10-CM | POA: Diagnosis not present

## 2023-12-16 DIAGNOSIS — I10 Essential (primary) hypertension: Secondary | ICD-10-CM

## 2023-12-16 DIAGNOSIS — F411 Generalized anxiety disorder: Secondary | ICD-10-CM

## 2023-12-16 DIAGNOSIS — R739 Hyperglycemia, unspecified: Secondary | ICD-10-CM

## 2023-12-16 DIAGNOSIS — D72829 Elevated white blood cell count, unspecified: Secondary | ICD-10-CM

## 2023-12-16 HISTORY — PX: TOTAL HIP ARTHROPLASTY: SHX124

## 2023-12-16 LAB — CBC
HCT: 42.9 % (ref 39.0–52.0)
Hemoglobin: 15.3 g/dL (ref 13.0–17.0)
MCH: 34 pg (ref 26.0–34.0)
MCHC: 35.7 g/dL (ref 30.0–36.0)
MCV: 95.3 fL (ref 80.0–100.0)
Platelets: 148 10*3/uL — ABNORMAL LOW (ref 150–400)
RBC: 4.5 MIL/uL (ref 4.22–5.81)
RDW: 12.7 % (ref 11.5–15.5)
WBC: 9.2 10*3/uL (ref 4.0–10.5)
nRBC: 0 % (ref 0.0–0.2)

## 2023-12-16 LAB — HIV ANTIBODY (ROUTINE TESTING W REFLEX): HIV Screen 4th Generation wRfx: NONREACTIVE

## 2023-12-16 LAB — COMPREHENSIVE METABOLIC PANEL WITH GFR
ALT: 88 U/L — ABNORMAL HIGH (ref 0–44)
AST: 60 U/L — ABNORMAL HIGH (ref 15–41)
Albumin: 3.5 g/dL (ref 3.5–5.0)
Alkaline Phosphatase: 67 U/L (ref 38–126)
Anion gap: 8 (ref 5–15)
BUN: 14 mg/dL (ref 6–20)
CO2: 23 mmol/L (ref 22–32)
Calcium: 8.4 mg/dL — ABNORMAL LOW (ref 8.9–10.3)
Chloride: 104 mmol/L (ref 98–111)
Creatinine, Ser: 0.62 mg/dL (ref 0.61–1.24)
GFR, Estimated: >60 mL/min (ref >60)
Glucose, Bld: 107 mg/dL — ABNORMAL HIGH (ref 70–99)
Potassium: 3.5 mmol/L (ref 3.5–5.1)
Sodium: 135 mmol/L (ref 135–145)
Total Bilirubin: 1.4 mg/dL — ABNORMAL HIGH (ref 0.0–1.2)
Total Protein: 6.6 g/dL (ref 6.5–8.1)

## 2023-12-16 LAB — HEMOGLOBIN A1C
Hgb A1c MFr Bld: 4.9 % (ref 4.8–5.6)
Mean Plasma Glucose: 93.93 mg/dL

## 2023-12-16 LAB — SURGICAL PCR SCREEN
MRSA, PCR: NEGATIVE
Staphylococcus aureus: NEGATIVE

## 2023-12-16 LAB — PHOSPHORUS: Phosphorus: 3.2 mg/dL (ref 2.5–4.6)

## 2023-12-16 SURGERY — ARTHROPLASTY, HIP, TOTAL,POSTERIOR APPROACH
Anesthesia: General | Site: Hip | Laterality: Right

## 2023-12-16 MED ORDER — CHLORHEXIDINE GLUCONATE 0.12 % MT SOLN
15.0000 mL | Freq: Once | OROMUCOSAL | Status: AC
  Administered 2023-12-16: 15 mL via OROMUCOSAL

## 2023-12-16 MED ORDER — THIAMINE MONONITRATE 100 MG PO TABS
100.0000 mg | ORAL_TABLET | Freq: Every day | ORAL | Status: DC
  Administered 2023-12-16 – 2023-12-17 (×2): 100 mg via ORAL
  Filled 2023-12-16 (×2): qty 1

## 2023-12-16 MED ORDER — ORAL CARE MOUTH RINSE: 15.0000 mL | Freq: Once | OROMUCOSAL | Status: AC

## 2023-12-16 MED ORDER — LORAZEPAM 2 MG/ML IJ SOLN: 1.0000 mg | INTRAMUSCULAR | Status: DC | PRN

## 2023-12-16 MED ORDER — OXYCODONE HCL 5 MG PO TABS: 5.0000 mg | ORAL_TABLET | Freq: Once | ORAL | Status: DC | PRN

## 2023-12-16 MED ORDER — ASPIRIN 81 MG PO TBEC
81.0000 mg | DELAYED_RELEASE_TABLET | Freq: Two times a day (BID) | ORAL | Status: DC
  Administered 2023-12-17: 81 mg via ORAL
  Filled 2023-12-16: qty 1

## 2023-12-16 MED ORDER — DEXAMETHASONE SODIUM PHOSPHATE 10 MG/ML IJ SOLN
INTRAMUSCULAR | Status: AC
  Filled 2023-12-16: qty 1

## 2023-12-16 MED ORDER — SUGAMMADEX SODIUM 200 MG/2ML IV SOLN
INTRAVENOUS | Status: DC | PRN
  Administered 2023-12-16: 200 mg via INTRAVENOUS

## 2023-12-16 MED ORDER — KETAMINE HCL 10 MG/ML IJ SOLN: INTRAMUSCULAR | Status: DC | PRN

## 2023-12-16 MED ORDER — ACETAMINOPHEN 10 MG/ML IV SOLN: 1000.0000 mg | Freq: Once | INTRAVENOUS | Status: DC | PRN

## 2023-12-16 MED ORDER — TRANEXAMIC ACID-NACL 1000-0.7 MG/100ML-% IV SOLN
1000.0000 mg | INTRAVENOUS | Status: AC
  Administered 2023-12-16: 1000 mg via INTRAVENOUS
  Filled 2023-12-16: qty 100

## 2023-12-16 MED ORDER — OXYCODONE HCL 5 MG/5ML PO SOLN: 5.0000 mg | Freq: Once | ORAL | Status: DC | PRN

## 2023-12-16 MED ORDER — LACTATED RINGERS IV SOLN: INTRAVENOUS | Status: DC

## 2023-12-16 MED ORDER — ADULT MULTIVITAMIN W/MINERALS CH
1.0000 | ORAL_TABLET | Freq: Every day | ORAL | Status: DC
  Administered 2023-12-16 – 2023-12-17 (×2): 1 via ORAL
  Filled 2023-12-16 (×2): qty 1

## 2023-12-16 MED ORDER — DEXAMETHASONE SODIUM PHOSPHATE 10 MG/ML IJ SOLN
INTRAMUSCULAR | Status: DC | PRN
  Administered 2023-12-16: 10 mg via INTRAVENOUS

## 2023-12-16 MED ORDER — ONDANSETRON HCL 4 MG/2ML IJ SOLN
INTRAMUSCULAR | Status: DC | PRN
  Administered 2023-12-16: 4 mg via INTRAVENOUS

## 2023-12-16 MED ORDER — KETOROLAC TROMETHAMINE 30 MG/ML IJ SOLN
INTRAMUSCULAR | Status: AC
  Filled 2023-12-16: qty 1

## 2023-12-16 MED ORDER — 0.9 % SODIUM CHLORIDE (POUR BTL) OPTIME
TOPICAL | Status: DC | PRN
  Administered 2023-12-16: 1000 mL

## 2023-12-16 MED ORDER — ONDANSETRON HCL 4 MG/2ML IJ SOLN
INTRAMUSCULAR | Status: AC
  Filled 2023-12-16: qty 2

## 2023-12-16 MED ORDER — HYDROMORPHONE HCL 2 MG/ML IJ SOLN
INTRAMUSCULAR | Status: AC
  Filled 2023-12-16: qty 1

## 2023-12-16 MED ORDER — FENTANYL CITRATE PF 50 MCG/ML IJ SOSY: 25.0000 ug | PREFILLED_SYRINGE | INTRAMUSCULAR | Status: DC | PRN

## 2023-12-16 MED ORDER — LIDOCAINE HCL (CARDIAC) PF 100 MG/5ML IV SOSY
PREFILLED_SYRINGE | INTRAVENOUS | Status: DC | PRN
  Administered 2023-12-16: 100 mg via INTRAVENOUS

## 2023-12-16 MED ORDER — LORAZEPAM 1 MG PO TABS: 1.0000 mg | ORAL_TABLET | ORAL | Status: DC | PRN

## 2023-12-16 MED ORDER — CHLORHEXIDINE GLUCONATE 4 % EX SOLN: 60.0000 mL | Freq: Once | CUTANEOUS | Status: DC

## 2023-12-16 MED ORDER — CEFAZOLIN SODIUM-DEXTROSE 2-4 GM/100ML-% IV SOLN
2.0000 g | INTRAVENOUS | Status: AC
  Administered 2023-12-16: 2 g via INTRAVENOUS
  Filled 2023-12-16: qty 100

## 2023-12-16 MED ORDER — OXYCODONE HCL 5 MG PO TABS
5.0000 mg | ORAL_TABLET | Freq: Once | ORAL | Status: AC
  Administered 2023-12-16: 5 mg via ORAL

## 2023-12-16 MED ORDER — FENTANYL CITRATE (PF) 100 MCG/2ML IJ SOLN
INTRAMUSCULAR | Status: AC
  Filled 2023-12-16: qty 2

## 2023-12-16 MED ORDER — SODIUM CHLORIDE 0.9 % IR SOLN
Status: DC | PRN
  Administered 2023-12-16: 1000 mL
  Administered 2023-12-16: 250 mL

## 2023-12-16 MED ORDER — PHENYLEPHRINE HCL-NACL 20-0.9 MG/250ML-% IV SOLN
INTRAVENOUS | Status: DC | PRN
  Administered 2023-12-16: 50 ug/min via INTRAVENOUS

## 2023-12-16 MED ORDER — MIDAZOLAM HCL 2 MG/2ML IJ SOLN
INTRAMUSCULAR | Status: DC | PRN
  Administered 2023-12-16 (×2): 1 mg via INTRAVENOUS

## 2023-12-16 MED ORDER — FOLIC ACID 1 MG PO TABS
1.0000 mg | ORAL_TABLET | Freq: Every day | ORAL | Status: DC
  Administered 2023-12-16 – 2023-12-17 (×2): 1 mg via ORAL
  Filled 2023-12-16 (×2): qty 1

## 2023-12-16 MED ORDER — HYDROMORPHONE HCL 1 MG/ML IJ SOLN
INTRAMUSCULAR | Status: DC | PRN
  Administered 2023-12-16: .5 mg via INTRAVENOUS
  Administered 2023-12-16: 1 mg via INTRAVENOUS
  Administered 2023-12-16: .5 mg via INTRAVENOUS

## 2023-12-16 MED ORDER — ROCURONIUM BROMIDE 10 MG/ML (PF) SYRINGE
PREFILLED_SYRINGE | INTRAVENOUS | Status: DC | PRN
  Administered 2023-12-16: 50 mg via INTRAVENOUS

## 2023-12-16 MED ORDER — FENTANYL CITRATE PF 50 MCG/ML IJ SOSY
PREFILLED_SYRINGE | INTRAMUSCULAR | Status: AC
  Filled 2023-12-16: qty 2

## 2023-12-16 MED ORDER — SODIUM CHLORIDE (PF) 0.9 % IJ SOLN
INTRAMUSCULAR | Status: AC
  Filled 2023-12-16: qty 30

## 2023-12-16 MED ORDER — PHENYLEPHRINE 80 MCG/ML (10ML) SYRINGE FOR IV PUSH (FOR BLOOD PRESSURE SUPPORT)
PREFILLED_SYRINGE | INTRAVENOUS | Status: DC | PRN
  Administered 2023-12-16: 160 ug via INTRAVENOUS
  Administered 2023-12-16 (×3): 80 ug via INTRAVENOUS

## 2023-12-16 MED ORDER — KETAMINE HCL 50 MG/5ML IJ SOSY
PREFILLED_SYRINGE | INTRAMUSCULAR | Status: DC | PRN
  Administered 2023-12-16: 30 mg via INTRAVENOUS

## 2023-12-16 MED ORDER — PROPOFOL 10 MG/ML IV BOLUS
INTRAVENOUS | Status: AC
  Filled 2023-12-16: qty 20

## 2023-12-16 MED ORDER — LIDOCAINE HCL (PF) 2 % IJ SOLN
INTRAMUSCULAR | Status: AC
  Filled 2023-12-16: qty 5

## 2023-12-16 MED ORDER — ROCURONIUM BROMIDE 10 MG/ML (PF) SYRINGE
PREFILLED_SYRINGE | INTRAVENOUS | Status: AC
  Filled 2023-12-16: qty 10

## 2023-12-16 MED ORDER — SODIUM CHLORIDE (PF) 0.9 % IJ SOLN
INTRAMUSCULAR | Status: AC
  Filled 2023-12-16: qty 10

## 2023-12-16 MED ORDER — ONDANSETRON HCL 4 MG/2ML IJ SOLN: 4.0000 mg | Freq: Once | INTRAMUSCULAR | Status: DC | PRN

## 2023-12-16 MED ORDER — FENTANYL CITRATE (PF) 100 MCG/2ML IJ SOLN
INTRAMUSCULAR | Status: DC | PRN
Start: 2023-12-16 — End: 2023-12-16
  Administered 2023-12-16 (×2): 50 ug via INTRAVENOUS

## 2023-12-16 MED ORDER — SODIUM CHLORIDE (PF) 0.9 % IJ SOLN
INTRAMUSCULAR | Status: DC | PRN
  Administered 2023-12-16: 80 mL

## 2023-12-16 MED ORDER — PROPOFOL 10 MG/ML IV BOLUS
INTRAVENOUS | Status: DC | PRN
  Administered 2023-12-16: 160 mg via INTRAVENOUS

## 2023-12-16 MED ORDER — BUPIVACAINE-EPINEPHRINE (PF) 0.25% -1:200000 IJ SOLN
INTRAMUSCULAR | Status: AC
  Filled 2023-12-16: qty 30

## 2023-12-16 MED ORDER — KETOROLAC TROMETHAMINE 30 MG/ML IJ SOLN
INTRAMUSCULAR | Status: DC | PRN
  Administered 2023-12-16: 30 mg via INTRAVENOUS

## 2023-12-16 MED ORDER — MIDAZOLAM HCL 2 MG/2ML IJ SOLN
INTRAMUSCULAR | Status: AC
  Filled 2023-12-16: qty 2

## 2023-12-16 MED ORDER — PRONTOSAN WOUND IRRIGATION OPTIME
TOPICAL | Status: DC | PRN
  Administered 2023-12-16: 1 via TOPICAL

## 2023-12-16 MED ORDER — POVIDONE-IODINE 10 % EX SWAB: 2.0000 | Freq: Once | CUTANEOUS | Status: DC

## 2023-12-16 MED ORDER — BUPIVACAINE LIPOSOME 1.3 % IJ SUSP
INTRAMUSCULAR | Status: AC
  Filled 2023-12-16: qty 20

## 2023-12-16 MED ORDER — ROCURONIUM BROMIDE 10 MG/ML (PF) SYRINGE: PREFILLED_SYRINGE | INTRAVENOUS | Status: DC | PRN

## 2023-12-16 MED ORDER — WATER FOR IRRIGATION, STERILE IR SOLN
Status: DC | PRN
  Administered 2023-12-16: 1000 mL

## 2023-12-16 MED ORDER — KETAMINE HCL 50 MG/5ML IJ SOSY
PREFILLED_SYRINGE | INTRAMUSCULAR | Status: AC
  Filled 2023-12-16: qty 5

## 2023-12-16 MED ORDER — THIAMINE HCL 100 MG/ML IJ SOLN
100.0000 mg | Freq: Every day | INTRAMUSCULAR | Status: DC
  Filled 2023-12-16: qty 2

## 2023-12-16 MED ORDER — ISOPROPYL ALCOHOL 70 % SOLN
Status: DC | PRN
  Administered 2023-12-16: 1 via TOPICAL

## 2023-12-16 MED ORDER — CEFAZOLIN SODIUM-DEXTROSE 2-4 GM/100ML-% IV SOLN
2.0000 g | Freq: Three times a day (TID) | INTRAVENOUS | Status: AC
  Administered 2023-12-16 – 2023-12-17 (×2): 2 g via INTRAVENOUS
  Filled 2023-12-16 (×2): qty 100

## 2023-12-16 SURGICAL SUPPLY — 65 items
BAG COUNTER SPONGE SURGICOUNT (BAG) IMPLANT
BAG ZIPLOCK 12X15 (MISCELLANEOUS) ×1 IMPLANT
BIT DRILL TRIDENT 4X40 SU (BIT) IMPLANT
BLADE SAW SAG 25X90X1.19 (BLADE) ×1 IMPLANT
CEMENT BONE SIMPLEX SPEEDSET (Cement) IMPLANT
CHLORAPREP W/TINT 26 (MISCELLANEOUS) ×2 IMPLANT
CNTNR URN SCR LID CUP LEK RST (MISCELLANEOUS) ×1 IMPLANT
COVER SURGICAL LIGHT HANDLE (MISCELLANEOUS) ×1 IMPLANT
DERMABOND ADVANCED .7 DNX12 (GAUZE/BANDAGES/DRESSINGS) ×2 IMPLANT
DRAPE HIP W/POCKET STRL (MISCELLANEOUS) ×1 IMPLANT
DRAPE INCISE IOBAN 85X60 (DRAPES) ×2 IMPLANT
DRAPE POUCH INSTRU U-SHP 10X18 (DRAPES) ×1 IMPLANT
DRAPE SHEET LG 3/4 BI-LAMINATE (DRAPES) ×3 IMPLANT
DRAPE U-SHAPE 47X51 STRL (DRAPES) ×2 IMPLANT
DRSG AQUACEL AG ADV 3.5X10 (GAUZE/BANDAGES/DRESSINGS) ×1 IMPLANT
ELECT BLADE TIP CTD 4 INCH (ELECTRODE) ×1 IMPLANT
ELECT REM PT RETURN 15FT ADLT (MISCELLANEOUS) ×1 IMPLANT
GAUZE SPONGE 4X4 12PLY STRL (GAUZE/BANDAGES/DRESSINGS) ×1 IMPLANT
GLOVE BIO SURGEON STRL SZ 6.5 (GLOVE) ×2 IMPLANT
GLOVE BIOGEL PI IND STRL 6.5 (GLOVE) ×1 IMPLANT
GLOVE BIOGEL PI IND STRL 8 (GLOVE) ×1 IMPLANT
GLOVE SURG ORTHO 8.0 STRL STRW (GLOVE) ×2 IMPLANT
GOWN STRL REUS W/ TWL XL LVL3 (GOWN DISPOSABLE) ×2 IMPLANT
HEAD CERAMIC V40 BIOLOX DEL 28 (Orthopedic Implant) IMPLANT
HOLDER FOLEY CATH W/STRAP (MISCELLANEOUS) ×1 IMPLANT
HOOD PEEL AWAY T7 (MISCELLANEOUS) ×3 IMPLANT
KIT BASIN OR (CUSTOM PROCEDURE TRAY) ×1 IMPLANT
KIT TURNOVER KIT A (KITS) IMPLANT
LINER 42MM E (Orthopedic Implant) IMPLANT
LINER ADM MDM INS 28/48 42E (Liner) IMPLANT
MANIFOLD NEPTUNE II (INSTRUMENTS) ×1 IMPLANT
MARKER SKIN DUAL TIP RULER LAB (MISCELLANEOUS) ×1 IMPLANT
NDL SAFETY ECLIPSE 18X1.5 (NEEDLE) ×2 IMPLANT
NS IRRIG 1000ML POUR BTL (IV SOLUTION) ×1 IMPLANT
PACK TOTAL JOINT (CUSTOM PROCEDURE TRAY) ×1 IMPLANT
PAD ARMBOARD POSITIONER FOAM (MISCELLANEOUS) ×1 IMPLANT
PROTECTOR NERVE ULNAR (MISCELLANEOUS) IMPLANT
RETRIEVER SUT HEWSON (MISCELLANEOUS) ×1 IMPLANT
SCREW HEX LP 6.5X25 (Screw) IMPLANT
SCREW HEX LP 6.5X30 (Screw) IMPLANT
SEALER BIPOLAR AQUA 6.0 (INSTRUMENTS) IMPLANT
SET HNDPC FAN SPRY TIP SCT (DISPOSABLE) IMPLANT
SHELL ACETABUL CLUSTER SZ 54 (Shell) IMPLANT
SHIELD FACE FULL FLUID (MISCELLANEOUS) ×1 IMPLANT
SOLUTION IRRIG SURGIPHOR (IV SOLUTION) IMPLANT
SOLUTION PRONTOSAN WOUND 350ML (IRRIGATION / IRRIGATOR) IMPLANT
SPIKE FLUID TRANSFER (MISCELLANEOUS) ×1 IMPLANT
STEM ACCOLADE SZ 6 (Hips) IMPLANT
SUCTION TUBE FRAZIER 12FR DISP (SUCTIONS) ×1 IMPLANT
SUT BONE WAX W31G (SUTURE) ×1 IMPLANT
SUT ETHIBOND #5 BRAIDED 30INL (SUTURE) ×1 IMPLANT
SUT MNCRL AB 3-0 PS2 18 (SUTURE) ×1 IMPLANT
SUT STRATAFIX 14 PDO 48 VLT (SUTURE) ×1 IMPLANT
SUT STRATAFIX PDO 1 14 VIOLET (SUTURE) ×1 IMPLANT
SUT VIC AB 0 CT1 36 (SUTURE) IMPLANT
SUT VIC AB 2-0 CT2 27 (SUTURE) ×2 IMPLANT
SUTURE STRATFX 0 PDS 27 VIOLET (SUTURE) ×1 IMPLANT
SYR 30ML LL (SYRINGE) ×1 IMPLANT
SYR 50ML LL SCALE MARK (SYRINGE) ×1 IMPLANT
TOWEL OR 17X26 10 PK STRL BLUE (TOWEL DISPOSABLE) ×1 IMPLANT
TOWER CARTRIDGE SMART MIX (DISPOSABLE) IMPLANT
TRAY FOLEY MTR SLVR 16FR STAT (SET/KITS/TRAYS/PACK) IMPLANT
TUBE SUCTION HIGH CAP CLEAR NV (SUCTIONS) ×1 IMPLANT
UNDERPAD 30X36 HEAVY ABSORB (UNDERPADS AND DIAPERS) ×1 IMPLANT
WATER STERILE IRR 1000ML POUR (IV SOLUTION) ×2 IMPLANT

## 2023-12-16 NOTE — Anesthesia Postprocedure Evaluation (Signed)
 Anesthesia Post Note  Patient: PRIYANSH TRAYER  Procedure(s) Performed: ARTHROPLASTY, HIP, TOTAL,POSTERIOR APPROACH (Right: Hip)     Patient location during evaluation: PACU Anesthesia Type: General Level of consciousness: awake and alert Pain management: pain level controlled Vital Signs Assessment: post-procedure vital signs reviewed and stable Respiratory status: spontaneous breathing, nonlabored ventilation, respiratory function stable and patient connected to nasal cannula oxygen Cardiovascular status: blood pressure returned to baseline and stable Postop Assessment: no apparent nausea or vomiting Anesthetic complications: no  No notable events documented.  Last Vitals:  Vitals:   12/16/23 1645 12/16/23 1700  BP: (!) 116/99 123/85  Pulse: 89 87  Resp: 11 15  Temp:    SpO2: 93% 94%    Last Pain:  Vitals:   12/16/23 1700  TempSrc:   PainSc: 4                  Rosalita Combe

## 2023-12-16 NOTE — Op Note (Signed)
 12/15/2023 - 12/16/2023  3:51 PM  PATIENT:  Tristan Gray   MRN: [id number]  PRE-OPERATIVE DIAGNOSIS: Right displaced femoral neck fracture  POST-OPERATIVE DIAGNOSIS:  same  PROCEDURE: Right press-fit total hip arthroplasty posterior approach  PREOPERATIVE INDICATIONS:    Tristan Gray is an 63 y.o. male who has a diagnosis of right displaced femoral neck fracture and elected for surgical management.  The risks benefits and alternatives were discussed with the patient including but not limited to the risks of nonoperative treatment, versus surgical intervention including infection, bleeding, nerve injury, periprosthetic fracture, the need for revision surgery, dislocation, leg length discrepancy, blood clots, cardiopulmonary complications, morbidity, mortality, among others, and they were willing to proceed.     OPERATIVE REPORT     SURGEON:  Priscille Brought, MD    ASSISTANT: Mason Sole, PA-C, (Present throughout the entire procedure,  necessary for completion of procedure in a timely manner, assisting with retraction, instrumentation, and closure)     ANESTHESIA: General  ESTIMATED BLOOD LOSS: 300 cc    COMPLICATIONS:  None.     COMPONENTS:   Stryker Trident 254 mm acetabular shell, 6.5 peg screws x 2, MDM cementless liner 42 mm, ADM/MDM dual mobility X3 head ball size 42E, Accolade 2 size #6 with 127 degree neck angle, 28+0 ceramic inner head ball Implant Name Type Inv. Item Serial No. Manufacturer Lot No. LRB No. Used Action  SHELL ACETABUL CLUSTER SZ 54 - [id number] Shell SHELL ACETABUL CLUSTER SZ 54  [employer] [id number] A Right 1 Implanted  LINER E - [id number] Orthopedic Implant LINER E  [employer] [id number] Right 1 Implanted  SCREW HEX LP 6.5X25 - [id number] Screw SCREW HEX LP 6.5X25  [employer] [id number] Right 1 Implanted  SCREW HEX LP 6.5X30 - [id number] Screw SCREW HEX LP 6.5X30  [employer] [id number] Right 1 Implanted  HEAD  CERAMIC V40 BIOLOX DEL 28 - [id number] Orthopedic Implant HEAD CERAMIC V40 BIOLOX DEL 28  [employer] [id number] Right 1 Implanted  LINER ADM MDM INS 28/48 42E - [id number] Liner LINER ADM MDM INS 28/48 42E  [employer] [id number] Right 1 Implanted  STEM ACCOLADE SZ 6 - [id number] Hips STEM ACCOLADE SZ 6  [employer] [id number] A Right 1 Implanted    The aquamantis was utilized for this case to help facilitate better hemostasis as patient was felt to be at increased risk of bleeding because of history of coagulopathy.        PROCEDURE IN DETAIL:   The patient was met in the holding area and  identified.  The appropriate hip was identified and marked at the operative site.  The patient was then transported to the OR  and  placed under anesthesia.  At that point, the patient was  placed in the lateral decubitus position with the operative side up and  secured to the operating room table  and all bony prominences padded. A subaxillary role was also placed.    The operative lower extremity was prepped from the iliac crest to the distal leg.  Sterile draping was performed.  Preoperative antibiotics, 2 gm of ancef,1 gm of Tranexamic Acid, and 8 mg of Decadron  administered. Time out was performed prior to incision.      A routine posterolateral approach was utilized via sharp dissection  carried down to the subcutaneous tissue.  Gross bleeders were Bovie coagulated.  The iliotibial band was identified and incised along the length of the skin incision through the glute max fascia.  Charnley  retractor was placed with care to protect the sciatic nerve posteriorly.  With the hip internally rotated, the piriformis tendon was identified and released from the femoral insertion and tagged with a #5 Ethibond.  A capsulotomy was then performed off the femoral insertion and also tagged with a #5 Ethibond.    The femoral neck was exposed, and I resected the femoral neck based on preoperative  templating relative to the lesser trochanter.    I then exposed the deep acetabulum, cleared out any tissue including the ligamentum teres.  After adequate visualization, I excised the labrum.  I then started reaming with a 48 mm reamer, first medializing to the floor of the cotyloid fossa, and then in the position of the cup aiming towards the greater sciatic notch, matching the version of the transverse acetabular ligament and tucked under the anterior wall. I reamed up to 54 mm reamer with good bony bed preparation and a 54 mm cup was chosen.  The real cup was then impacted into place.  Appropriate version and inclination was confirmed clinically matching their bony anatomy, and also with the use of the jig.  I placed 2 screws in the posterior superior quadrant to augment fixation.  A neutral liner was placed and impacted. It was confirmed to be appropriately seated and the acetabular retractors were removed.    I then prepared the proximal femur using the box cutter, Charnley awl, and then sequentially broached starting with 0 up to a size 6.  A trial broach, neck, and head was utilized, and I reduced the hip and it was found to have excellent stability.  There was no impingement with full extension and 90 degrees external rotation.  The hip was stable at the position of sleep and with 90 degrees flexion and 60 degrees of internal rotation.  Leg lengths were also clinically assessed in the lateral position and felt to be equal. Intra-Op flatplate was obtained and confirmed appropriate component positions.  Good fill of the femur with the size 6 broach.  And restoration of leg length and offset. No evidence or concern for fracture.  A final femoral prosthesis size 6 was selected. I then impacted the real femoral prosthesis into place.I again trialed and selected a 28+0 mm ball. The hip was then reduced and taken through a range of motion. There was no impingement with full extension and 90 degrees  external rotation.  The hip was stable at the position of sleep and with 90 degrees flexion and 60 degrees of internal rotation. Leg lengths were  again assessed and felt to be restored.  We then opened, and I impacted the real head ball into place.  The posterior capsule was then closed with #5 Ethibond.  The piriformis was repaired through the base of the abductor tendon using a [place] suture passer.  I then irrigated the hip copiously with Irrisept and with normal saline pulse lavage. Periarticular injection was then performed with Exparel .   We repaired the fascia #1 barbed suture, followed by 0 barbed suture for the subcutaneous fat.  Skin was closed with 2-0 Vicryl and 3-0 Monocryl.  Dermabond and Aquacel dressing were applied. The patient was then awakened and returned to PACU in stable and satisfactory condition.  Leg lengths in the supine position were assessed and felt to be clinically equal. There were no complications.  Post op recs: WB: WBAT RLE, No formal hip precautions Abx: ancef Imaging: PACU pelvis Xray Dressing: Aquacell, keep intact until follow up DVT  prophylaxis: Aspirin 81BID starting POD1 Follow up: 2 weeks after surgery for a wound check with Dr. Pryor Browning at [facility].  Address: [address], [place], [place] [address]  Office Phone: [phone]   Priscille Brought, MD Orthopedic Surgeon

## 2023-12-16 NOTE — Transfer of Care (Signed)
 Immediate Anesthesia Transfer of Care Note  Patient: Tristan Gray  Procedure(s) Performed: ARTHROPLASTY, HIP, TOTAL,POSTERIOR APPROACH (Right: Hip)  Patient Location: PACU  Anesthesia Type:General  Level of Consciousness: drowsy  Airway & Oxygen Therapy: Patient Spontanous Breathing and Patient connected to face mask oxygen  Post-op Assessment: Report given to RN, Post -op Vital signs reviewed and stable, and Patient moving all extremities X 4  Post vital signs: Reviewed and stable  Last Vitals:  Vitals Value Taken Time  BP 118/83   Temp    Pulse 87 12/16/23 1628  Resp 14 12/16/23 1628  SpO2 92 % 12/16/23 1628  Vitals shown include unfiled device data.  Last Pain:  Vitals:   12/16/23 1256  TempSrc:   PainSc: 10-Worst pain ever      Patients Stated Pain Goal: 4 (12/16/23 1236)  Complications: No notable events documented.

## 2023-12-16 NOTE — Anesthesia Preprocedure Evaluation (Signed)
 Anesthesia Evaluation  Patient identified by MRN, date of birth, ID band Patient awake    Reviewed: Allergy & Precautions, NPO status , Patient's Chart, lab work & pertinent test results, reviewed documented beta blocker date and time   History of Anesthesia Complications Negative for: history of anesthetic complications  Airway Mallampati: III  TM Distance: >3 FB     Dental no notable dental hx.    Pulmonary neg COPD, former smoker   breath sounds clear to auscultation       Cardiovascular hypertension, (-) angina (-) CAD, (-) Past MI, (-) Cardiac Stents and (-) CHF  Rhythm:Regular Rate:Normal     Neuro/Psych  PSYCHIATRIC DISORDERS Anxiety      Neuromuscular disease    GI/Hepatic ,neg GERD  ,,(+) neg Cirrhosis    substance abuse  alcohol use  Endo/Other    Renal/GU Renal disease     Musculoskeletal   Abdominal   Peds  Hematology  (+) Blood dyscrasia (mild thrombocytopenia)   Anesthesia Other Findings   Reproductive/Obstetrics                              Anesthesia Physical Anesthesia Plan  ASA: 2  Anesthesia Plan: General   Post-op Pain Management:    Induction: Intravenous  PONV Risk Score and Plan: 2 and Ondansetron  and Dexamethasone   Airway Management Planned: LMA  Additional Equipment:   Intra-op Plan:   Post-operative Plan: Extubation in OR  Informed Consent: I have reviewed the patients History and Physical, chart, labs and discussed the procedure including the risks, benefits and alternatives for the proposed anesthesia with the patient or authorized representative who has indicated his/her understanding and acceptance.     Dental advisory given  Plan Discussed with: CRNA  Anesthesia Plan Comments:          Anesthesia Quick Evaluation

## 2023-12-16 NOTE — Progress Notes (Signed)
 Progress Note   Patient: Tristan Gray ZOX:[id number] DOB: 1961/07/10 DOA: 12/15/2023     1 DOS: the patient was seen and examined on 12/16/2023   Brief hospital course: Tristan Gray is a 63 y.o. male with medical history significant of nephrolithiasis, hypertension, chronic back pain, lumbar radiculopathy, history incarcerated ventral hernia, hyperlipidemia, elevated liver enzymes, generalized anxiety disorder who was washing his car at home, slipped, fell on his right hip area developing immediate pain and inability to bear weight on his RLE. Right hip x-ray showing displaced right femoral neck fracture. Bilateral hip arthritic changes. Lower lumbar fusion seen and soft tissues were unremarkable. He is admitted to [facility] service with orthopedic surgery consult.  Assessment and Plan: Closed right hip fracture, initial encounter (HCC) Continue pain control. Orthopedic surgery plan to take him to OR today. Continue antiemetics. PT/ OT eval after surgery.   Chronic back pain Lumbar radiculopathy Currently taking 1 Percocet as needed daily.   Elevated liver enzymes Hepatic steatosis Alcoholic liver disease (HCC) Alcohol cessation advised. Denied any history of withdrawals. CIWA as per floor protocol as needed.   Essential hypertension Continue amlodipine  10 mg p.o. bedtime. Continue lisinopril  40 mg p.o. daily.   Generalized anxiety disorder Continue bupropion  and fluoxetine .   Hyperlipidemia Hold pravastatin  due to transaminitis.   Leukocytosis- improved. Secondary to acute fracture.   Hyperglycemia Check A1c Further workup depending on results.       Out of bed to chair. Incentive spirometry. Nursing supportive care. Fall, aspiration precautions. Diet:  Diet Orders (From admission, onward)     Start     Ordered   12/16/23 0650  Diet NPO time specified  Diet effective now        12/16/23 8119           DVT prophylaxis: SCDs Start: 12/15/23 1754  Level of  care: Med-Surg   Code Status: Full Code  Subjective: Patient is seen and examined today morning. He is lying in bed. Asked for pain meds. Significant other at bedside. Awaiting OR this afternoon.  Physical Exam: Vitals:   12/16/23 1059 12/16/23 1236 12/16/23 1243 12/16/23 1628  BP: (!) 130/97  (!) 128/99 113/83  Pulse: 81  79 86  Resp: 20  18 17   Temp: 98.1 F (36.7 C)  97.8 F (36.6 C) (!) 97.5 F (36.4 C)  TempSrc:   Oral   SpO2: 95%  94% 94%  Weight:  91.2 kg    Height:  6' (1.829 m)      General - Middle aged Caucasian male, mild distress due to pain HEENT - PERRLA, EOMI, atraumatic head, non tender sinuses. Lung - Clear, no rales, rhonchi, wheezes. Heart - S1, S2 heard, no murmurs, rubs, trace pedal edema. Abdomen - Soft, non tender, bowel sounds good Neuro - Alert, awake and oriented x 3, non focal exam. Skin - Warm and dry. Left foot drop. Right hip pain tenderness  Data Reviewed:      Latest Ref Rng & Units 12/16/2023    5:39 AM 12/15/2023    4:22 PM 04/06/2020   10:15 AM  CBC  WBC 4.0 - 10.5 K/uL 9.2  13.7  6.1   Hemoglobin 13.0 - 17.0 g/dL 14.7  82.9  56.2   Hematocrit 39.0 - 52.0 % 42.9  45.9  48.9   Platelets 150 - 400 K/uL 148  200  179       Latest Ref Rng & Units 12/16/2023    5:39 AM 12/15/2023  4:22 PM 04/06/2020   10:15 AM  BMP  Glucose 70 - 99 mg/dL 409  811  90   BUN 6 - 20 mg/dL 14  17  10    Creatinine 0.61 - 1.24 mg/dL 9.14  7.82  9.56   Sodium 135 - 145 mmol/L 135  138  139   Potassium 3.5 - 5.1 mmol/L 3.5  4.0  4.0   Chloride 98 - 111 mmol/L 104  105  109   CO2 22 - 32 mmol/L 23  23  20    Calcium 8.9 - 10.3 mg/dL 8.4  9.1  9.1    DG Chest 1 View Result Date: 12/15/2023 CLINICAL DATA:  Fall. EXAM: CHEST  1 VIEW COMPARISON:  Chest radiograph dated 03/10/2007. FINDINGS: No focal consolidation, pleural effusion or pneumothorax. The cardiac silhouette is within normal limits. No acute osseous pathology. IMPRESSION: No active disease. Electronically  Signed   By: Angus Bark M.D.   On: 12/15/2023 17:21   DG Hip Unilat  With Pelvis 2-3 Views Right Result Date: 12/15/2023 CLINICAL DATA:  Fall. EXAM: DG HIP (WITH OR WITHOUT PELVIS) 2-3V RIGHT COMPARISON:  None Available. FINDINGS: There is a displaced and impacted fracture of the right femoral neck. No dislocation. Mild bilateral hip arthritic changes. Lower lumbar fusion. The soft tissues are unremarkable. IMPRESSION: Displaced right femoral neck fracture. Electronically Signed   By: Angus Bark M.D.   On: 12/15/2023 17:21    Family Communication: Discussed with patient, wife at bedside. They understand and agree. All questions answered.  Disposition: Status is: Inpatient Remains inpatient appropriate because: orthopedic surgery  Planned Discharge Destination: Home with Home Health     Time spent: 40 minutes  Author: Aisha Hove, MD 12/16/2023 4:34 PM Secure chat 7am to 7pm For on call review [email or web address].

## 2023-12-16 NOTE — TOC Initial Note (Signed)
 Transition of Care [facility]) - Initial/Assessment Note    Patient Details  Name: Tristan Gray MRN: [id number] Date of Birth: [birth year]  Transition of Care [facility] CM/SW Contact:    Delilah Fend, LCSW Phone Number: 12/16/2023, 10:39 AM  Clinical Narrative:                  Met with pt and spouse today to introduce TOC/ CSW role with dc planning needs.  Pt engaged and eager to have surgery and return home.  Wife very supportive and will be primary support at dc.  Confirming pt will need RW for home and pt has no DME agency preference.  Await additional recommendations once PT has completed evaluation.  Continue to follow.  Expected Discharge Plan: Home w Home Health Services (vs. OP) Barriers to Discharge: Continued Medical Work up   Patient Goals and CMS Choice Patient states their goals for this hospitalization and ongoing recovery are:: return home          Expected Discharge Plan and Services In-house Referral: Clinical Social Work     Living arrangements for the past 2 months: Single Family Home                                      Prior Living Arrangements/Services Living arrangements for the past 2 months: Single Family Home Lives with:: Spouse Patient language and need for interpreter reviewed:: Yes Do you feel safe going back to the place where you live?: Yes      Need for Family Participation in Patient Care: Yes (Comment) Care giver support system in place?: Yes (comment)   Criminal Activity/Legal Involvement Pertinent to Current Situation/Hospitalization: No - Comment as needed  Activities of Daily Living   ADL Screening (condition at time of admission) Independently performs ADLs?: Yes (appropriate for developmental age) Is the patient deaf or have difficulty hearing?: No Does the patient have difficulty seeing, even when wearing glasses/contacts?: No Does the patient have difficulty concentrating, remembering, or making decisions?: No  Permission  Sought/Granted Permission sought to share information with : Family Supports Permission granted to share information with : Yes, Verbal Permission Granted  Share Information with NAME: spouse, Jairen Trembly @ [phone]           Emotional Assessment Appearance:: Appears stated age Attitude/Demeanor/Rapport: Engaged, Gracious Affect (typically observed): Accepting, Pleasant Orientation: : Oriented to Self, Oriented to Place, Oriented to  Time, Oriented to Situation Alcohol / Substance Use: Not Applicable Psych Involvement: No (comment)  Admission diagnosis:  Closed fracture of neck of right femur, initial encounter (HCC) [S72.001A] Closed right hip fracture, initial encounter (HCC) [S72.001A] Patient Active Problem List   Diagnosis Date Noted   Hyperlipidemia 12/15/2023   Chronic back pain 12/15/2023   Closed right hip fracture, initial encounter (HCC) 12/15/2023   Hepatic steatosis 12/15/2023   Leukocytosis 12/15/2023   Hyperglycemia 12/15/2023   Alcoholic liver disease (HCC) 12/15/2023   Soft tissue mass 11/15/2023   Incarcerated ventral hernia 05/04/2022   Lumbar radiculopathy 04/12/2020   Elevated liver enzymes 05/23/2017   Vitamin D deficiency 01/17/2017   Generalized anxiety disorder 01/14/2017   Essential hypertension 05/14/2012   PCP:  Chandler Combs, MD Pharmacy:   [facility] [phone] [address] [address], [place] - [address] [place] [address] Phone: [phone] Fax: [phone]     Social Drivers of Health (SDOH) Social History: SDOH Screenings  Food Insecurity: No Food Insecurity (12/15/2023)  Housing: Low Risk  (12/15/2023)  Transportation Needs: No Transportation Needs (12/15/2023)  Utilities: Not At Risk (12/15/2023)  Financial Resource Strain: Low Risk  (10/01/2023)   Received from [facility]  Physical Activity: Insufficiently Active (10/01/2023)   Received from [facility]  Social Connections: Socially Integrated  (10/01/2023)   Received from [facility]  Stress: Stress Concern Present (10/01/2023)   Received from [facility]  Tobacco Use: Medium Risk (12/15/2023)   SDOH Interventions:     Readmission Risk Interventions    12/16/2023   10:38 AM  Readmission Risk Prevention Plan  Post Dischage Appt Complete  Medication Screening Complete  Transportation Screening Complete

## 2023-12-16 NOTE — Consult Note (Signed)
 ORTHOPAEDIC CONSULTATION  REQUESTING PHYSICIAN: Aisha Hove, MD  Chief Complaint: Right hip injury  HPI: Tristan Gray is a 63 y.o. male who history of lumbar fusion with chronic right foot drop sustained a fall at home yesterday while washing his car.  Immediately after the fall he had pain in his right hip unable to ambulate.  Was brought to the emergency room found to have a right femoral neck fracture he notes pain in his lower back and right hip area.  Denies pain other joints or extremities.  He has chronic weakness numbness tingling in the right foot which are at baseline from his right foot drop.  Past Medical History:  Diagnosis Date   History of kidney stones    Hypertension    Past Surgical History:  Procedure Laterality Date   LUMBAR DISC SURGERY  2008   Social History   Socioeconomic History   Marital status: Married    Spouse name: Not on file   Number of children: Not on file   Years of education: Not on file   Highest education level: Not on file  Occupational History   Not on file  Tobacco Use   Smoking status: Former   Smokeless tobacco: Never  Vaping Use   Vaping status: Never Used  Substance and Sexual Activity   Alcohol use: Yes    Comment: socially   Drug use: Never   Sexual activity: Not on file  Other Topics Concern   Not on file  Social History Narrative   Not on file   Social Drivers of Health   Financial Resource Strain: Low Risk  (10/01/2023)   Received from [facility]   Overall Financial Resource Strain (CARDIA)    Difficulty of Paying Living Expenses: Not very hard  Food Insecurity: No Food Insecurity (12/15/2023)   Hunger Vital Sign    Worried About Running Out of Food in the Last Year: Never true    Ran Out of Food in the Last Year: Never true  Transportation Needs: No Transportation Needs (12/15/2023)   PRAPARE - Administrator, Civil Service (Medical): No    Lack of Transportation (Non-Medical): No   Physical Activity: Insufficiently Active (10/01/2023)   Received from [facility]   Exercise Vital Sign    Days of Exercise per Week: 4 days    Minutes of Exercise per Session: 30 min  Stress: Stress Concern Present (10/01/2023)   Received from [facility] of Occupational Health - Occupational Stress Questionnaire    Feeling of Stress : To some extent  Social Connections: Socially Integrated (10/01/2023)   Received from [facility]   Social Network    How would you rate your social network (family, work, friends)?: Good participation with social networks   Family History  Problem Relation Age of Onset   Pancreatic cancer Mother    Emphysema Sister    Allergies  Allergen Reactions   Prednisone Swelling   Penicillins     Childhood  reaction     Positive ROS: All other systems have been reviewed and were otherwise negative with the exception of those mentioned in the HPI and as above.  Physical Exam: General: Alert, no acute distress Cardiovascular: No pedal edema Respiratory: No cyanosis, no use of accessory musculature Skin: No lesions in the area of chief complaint Neurologic: Sensation intact distally on the left, decreased on the right Psychiatric: Patient is competent for consent with normal mood and affect  MUSCULOSKELETAL:  RLE No traumatic wounds, ecchymosis, or rash  Nontender  Logroll deferred given known fracture  No knee or ankle effusion  Knee stable to varus/ valgus stress  No active dorsiflexion plantarflexion in the right foot due to her chronic foot drop  Decreased sensation in the right dorsal plantar aspects of the foot at baseline  DP 2+, PT 2+, No significant edema  LLE No traumatic wounds, ecchymosis, or rash  Nontender  No groin pain with log roll  No knee or ankle effusion  Knee stable to varus/ valgus stress  Sens DPN, SPN, TN intact  Motor EHL, ext, flex 5/5  DP 2+, PT 2+, No significant edema   IMAGING: X-rays  pelvis and right hip demonstrate displaced right femoral neck fracture  Assessment: Principal Problem:   Closed right hip fracture, initial encounter (HCC) Active Problems:   Lumbar radiculopathy   Elevated liver enzymes   Essential hypertension   Generalized anxiety disorder   Hyperlipidemia   Chronic back pain   Hepatic steatosis   Leukocytosis   Hyperglycemia   Alcoholic liver disease (HCC)  Right displaced femoral neck fracture  Plan: X-ray examination consistent with displaced right femoral neck fracture.  Given the patient's age and health would benefit from total hip arthroplasty for pain control and mobility.  The risks benefits and alternatives were discussed with the patient including but not limited to the risks of nonoperative treatment, versus surgical intervention including infection, bleeding, nerve injury, malunion, nonunion, the need for revision surgery, hardware prominence, hardware failure, the need for hardware removal, blood clots, cardiopulmonary complications, morbidity, mortality, among others, and they were willing to proceed.       Murleen Arms, MD  Contact information:   [id number] 7am-5pm epic message Dr. Pryor Browning, or call office for patient follow up: [phone] After hours and holidays please check [email or web address] for group call information for [facility]

## 2023-12-16 NOTE — Anesthesia Procedure Notes (Signed)
 Procedure Name: Intubation Date/Time: 12/16/2023 2:20 PM  Performed by: Vella Gey, CRNAPre-anesthesia Checklist: Patient identified, Emergency Drugs available, Suction available and Patient being monitored Patient Re-evaluated:Patient Re-evaluated prior to induction Oxygen Delivery Method: Circle system utilized Preoxygenation: Pre-oxygenation with 100% oxygen Induction Type: IV induction Ventilation: Mask ventilation without difficulty Laryngoscope Size: 3 and Miller Grade View: Grade I Tube type: Oral Tube size: 8.0 mm Number of attempts: 1 Airway Equipment and Method: Stylet and Oral airway Placement Confirmation: ETT inserted through vocal cords under direct vision, positive ETCO2 and breath sounds checked- equal and bilateral Secured at: 22 cm Tube secured with: Tape Dental Injury: Teeth and Oropharynx as per pre-operative assessment

## 2023-12-16 NOTE — Progress Notes (Signed)
 Nutrition Follow-up  DOCUMENTATION CODES:   Not applicable  INTERVENTION:  - Recommend Regular diet after OR. - Ensure Plus High Protein po BID once diet advanced. Each supplement provides 350 kcal and 20 grams of protein. - Multivitamin with minerals daily. - Monitor weight trends.  NUTRITION DIAGNOSIS:   Increased nutrient needs related to hip fracture as evidenced by estimated needs.  GOAL:   Patient will meet greater than or equal to 90% of their needs  MONITOR:   PO intake, Supplement acceptance, Weight trends  REASON FOR ASSESSMENT:   Consult Hip fracture protocol  ASSESSMENT:   63 y.o. male with PMH significant of nephrolithiasis, HTN, lumbar radiculopathy, HLD, elevated liver enzymes, generalized anxiety disorder who was washing his car at home, slipped, fell on his right hip area developing immediate pain and inability to bear weight on his RLE. Admitted for right hip fracture.  Patient reports a UBW of 200# and denies any recent changes in weight.  Typically consumes 3 meals a day plus snacks in between at home. Eating normally up until admission.  He is currently NPO for OR today. Discussed increased kcal and protein needs to promote healing. Patient agreeable to receive Ensure to support intake.    Medications reviewed and include: 1mg  folic acid, MVI, 100mg  thiamine  Labs reviewed:  -   NUTRITION - FOCUSED PHYSICAL EXAM:  Flowsheet Row Most Recent Value  Orbital Region Mild depletion  Upper Arm Region No depletion  Thoracic and Lumbar Region No depletion  Buccal Region No depletion  Temple Region No depletion  Clavicle Bone Region No depletion  Clavicle and Acromion Bone Region Mild depletion  Scapular Bone Region Unable to assess  Dorsal Hand No depletion  Patellar Region Unable to assess  [pt requesting not to be moved/touched with fx]  Anterior Thigh Region Unable to assess  [pt requesting not to be moved/touched with fx]  Posterior Calf  Region Unable to assess  [pt requesting not to be moved/touched with fx]  Edema (RD Assessment) None  Hair Reviewed  Eyes Reviewed  Mouth Reviewed  Skin Reviewed  Nails Reviewed       Diet Order:   Diet Order             Diet NPO time specified  Diet effective now                   EDUCATION NEEDS:  Education needs have been addressed  Skin:  Skin Assessment: Reviewed RN Assessment  Last BM:  5/4  Height:  Ht Readings from Last 1 Encounters:  12/16/23 6' (1.829 m)   Weight:  Wt Readings from Last 1 Encounters:  12/16/23 91.2 kg   Ideal Body Weight:  80.91 kg  BMI:  Body mass index is 27.26 kg/m.  Estimated Nutritional Needs:  Kcal:  2100-2300 kcals Protein:  100-120 grams Fluid:  >/= 2.1L    Scheryl Cushing RD, LDN Contact via Secure Chat.

## 2023-12-16 NOTE — H&P (View-Only) (Signed)
 ORTHOPAEDIC CONSULTATION  REQUESTING PHYSICIAN: Aisha Hove, MD  Chief Complaint: Right hip injury  HPI: [name] is a 63 y.o. male who history of lumbar fusion with chronic right foot drop sustained a fall at home yesterday while washing his car.  Immediately after the fall he had pain in his right hip unable to ambulate.  Was brought to the emergency room found to have a right femoral neck fracture he notes pain in his lower back and right hip area.  Denies pain other joints or extremities.  He has chronic weakness numbness tingling in the right foot which are at baseline from his right foot drop.  Past Medical History:  Diagnosis Date   History of kidney stones    Hypertension    Past Surgical History:  Procedure Laterality Date   LUMBAR DISC SURGERY  2008   Social History   Socioeconomic History   Marital status: Married    Spouse name: Not on file   Number of children: Not on file   Years of education: Not on file   Highest education level: Not on file  Occupational History   Not on file  Tobacco Use   Smoking status: Former   Smokeless tobacco: Never  Vaping Use   Vaping status: Never Used  Substance and Sexual Activity   Alcohol use: Yes    Comment: socially   Drug use: Never   Sexual activity: Not on file  Other Topics Concern   Not on file  Social History Narrative   Not on file   Social Drivers of Health   Financial Resource Strain: Low Risk  (10/01/2023)   Received from [facility]   Overall Financial Resource Strain (CARDIA)    Difficulty of Paying Living Expenses: Not very hard  Food Insecurity: No Food Insecurity (12/15/2023)   Hunger Vital Sign    Worried About Running Out of Food in the Last Year: Never true    Ran Out of Food in the Last Year: Never true  Transportation Needs: No Transportation Needs (12/15/2023)   PRAPARE - Administrator, Civil Service (Medical): No    Lack of Transportation (Non-Medical): No   Physical Activity: Insufficiently Active (10/01/2023)   Received from [facility]   Exercise Vital Sign    Days of Exercise per Week: 4 days    Minutes of Exercise per Session: 30 min  Stress: Stress Concern Present (10/01/2023)   Received from [facility] of Occupational Health - Occupational Stress Questionnaire    Feeling of Stress : To some extent  Social Connections: Socially Integrated (10/01/2023)   Received from [facility]   Social Network    How would you rate your social network (family, work, friends)?: Good participation with social networks   Family History  Problem Relation Age of Onset   Pancreatic cancer Mother    Emphysema Sister    Allergies  Allergen Reactions   Prednisone Swelling   Penicillins     Childhood  reaction     Positive ROS: All other systems have been reviewed and were otherwise negative with the exception of those mentioned in the HPI and as above.  Physical Exam: General: Alert, no acute distress Cardiovascular: No pedal edema Respiratory: No cyanosis, no use of accessory musculature Skin: No lesions in the area of chief complaint Neurologic: Sensation intact distally on the left, decreased on the right Psychiatric: Patient is competent for consent with normal mood and affect  MUSCULOSKELETAL:  RLE No traumatic wounds, ecchymosis, or rash  Nontender  Logroll deferred given known fracture  No knee or ankle effusion  Knee stable to varus/ valgus stress  No active dorsiflexion plantarflexion in the right foot due to her chronic foot drop  Decreased sensation in the right dorsal plantar aspects of the foot at baseline  DP 2+, PT 2+, No significant edema  LLE No traumatic wounds, ecchymosis, or rash  Nontender  No groin pain with log roll  No knee or ankle effusion  Knee stable to varus/ valgus stress  Sens DPN, SPN, TN intact  Motor EHL, ext, flex 5/5  DP 2+, PT 2+, No significant edema   IMAGING: X-rays  pelvis and right hip demonstrate displaced right femoral neck fracture  Assessment: Principal Problem:   Closed right hip fracture, initial encounter (HCC) Active Problems:   Lumbar radiculopathy   Elevated liver enzymes   Essential hypertension   Generalized anxiety disorder   Hyperlipidemia   Chronic back pain   Hepatic steatosis   Leukocytosis   Hyperglycemia   Alcoholic liver disease (HCC)  Right displaced femoral neck fracture  Plan: X-ray examination consistent with displaced right femoral neck fracture.  Given the patient's age and health would benefit from total hip arthroplasty for pain control and mobility.  The risks benefits and alternatives were discussed with the patient including but not limited to the risks of nonoperative treatment, versus surgical intervention including infection, bleeding, nerve injury, malunion, nonunion, the need for revision surgery, hardware prominence, hardware failure, the need for hardware removal, blood clots, cardiopulmonary complications, morbidity, mortality, among others, and they were willing to proceed.       Murleen Arms, MD  Contact information:   [id number] 7am-5pm epic message Dr. Pryor Browning, or call office for patient follow up: [phone] After hours and holidays please check [email or web address] for group call information for [facility]

## 2023-12-16 NOTE — Interval H&P Note (Signed)
 The patient has been re-examined, and the chart reviewed, and there have been no interval changes to the documented history and physical.    Plan for L THA for Left femoral neck fracture  The operative side was examined and the patient was confirmed to have decreased sensation to the dorsal and plantar aspects of the foot, absent dorsiflexion, weak plantarflexion, and DP 2+, PT 2+, No significant edema.   The risks, benefits, and alternatives have been discussed at length with patient, and the patient is willing to proceed.  Left hip marked. Consent has been signed.

## 2023-12-16 NOTE — Discharge Instructions (Signed)
 INSTRUCTIONS AFTER JOINT REPLACEMENT   Remove items at home which could result in a fall. This includes throw rugs or furniture in walking pathways ICE to the affected joint every three hours while awake for 30 minutes at a time, for at least the first 3-5 days, and then as needed for pain and swelling.  Continue to use ice for pain and swelling. You may notice swelling that will progress down to the foot and ankle.  This is normal after surgery.  Elevate your leg when you are not up walking on it.   Continue to use the breathing machine you got in the hospital (incentive spirometer) which will help keep your temperature down.  It is common for your temperature to cycle up and down following surgery, especially at night when you are not up moving around and exerting yourself.  The breathing machine keeps your lungs expanded and your temperature down.  DIET:  As you were doing prior to hospitalization, we recommend a well-balanced diet.  DRESSING / WOUND CARE / SHOWERING:  Keep the surgical dressing until follow up.  The dressing is water proof, so you can shower without any extra covering.  IF THE DRESSING FALLS OFF or the wound gets wet inside, change the dressing with sterile gauze.  Please use good hand washing techniques before changing the dressing.  Do not use any lotions or creams on the incision until instructed by your surgeon.    ACTIVITY  Increase activity slowly as tolerated, but follow the weight bearing instructions below.   No driving for 6 weeks or until further direction given by your physician.  You cannot drive while taking narcotics.  No lifting or carrying greater than 10 lbs. until further directed by your surgeon. Avoid periods of inactivity such as sitting longer than an hour when not asleep. This helps prevent blood clots.  You may return to work once you are authorized by your doctor.   WEIGHT BEARING: Weight bearing as tolerated with assist device (walker, cane, etc) as  directed, use it as long as suggested by your surgeon or therapist, typically at least 4-6 weeks.  EXERCISES  Results after joint replacement surgery are often greatly improved when you follow the exercise, range of motion and muscle strengthening exercises prescribed by your doctor. Safety measures are also important to protect the joint from further injury. Any time any of these exercises cause you to have increased pain or swelling, decrease what you are doing until you are comfortable again and then slowly increase them. If you have problems or questions, call your caregiver or physical therapist for advice.   Rehabilitation is important following a joint replacement. After just a few days of immobilization, the muscles of the leg can become weakened and shrink (atrophy).  These exercises are designed to build up the tone and strength of the thigh and leg muscles and to improve motion. Often times heat used for twenty to thirty minutes before working out will loosen up your tissues and help with improving the range of motion but do not use heat for the first two weeks following surgery (sometimes heat can increase post-operative swelling).   These exercises can be done on a training (exercise) mat, on the floor, on a table or on a bed. Use whatever works the best and is most comfortable for you.    Use music or television while you are exercising so that the exercises are a pleasant break in your day. This will make your life  better with the exercises acting as a break in your routine that you can look forward to.   Perform all exercises about fifteen times, three times per day or as directed.  You should exercise both the operative leg and the other leg as well.  Exercises include:   Quad Sets - Tighten up the muscle on the front of the thigh (Quad) and hold for 5-10 seconds.   Straight Leg Raises - With your knee straight (if you were given a brace, keep it on), lift the leg to 60 degrees, hold  for 3 seconds, and slowly lower the leg.  Perform this exercise against resistance later as your leg gets stronger.  Leg Slides: Lying on your back, slowly slide your foot toward your buttocks, bending your knee up off the floor (only go as far as is comfortable). Then slowly slide your foot back down until your leg is flat on the floor again.  Angel Wings: Lying on your back spread your legs to the side as far apart as you can without causing discomfort.  Hamstring Strength:  Lying on your back, push your heel against the floor with your leg straight by tightening up the muscles of your buttocks.  Repeat, but this time bend your knee to a comfortable angle, and push your heel against the floor.  You may put a pillow under the heel to make it more comfortable if necessary.   A rehabilitation program following joint replacement surgery can speed recovery and prevent re-injury in the future due to weakened muscles. Contact your doctor or a physical therapist for more information on knee rehabilitation.   CONSTIPATION:  Constipation is defined medically as fewer than three stools per week and severe constipation as less than one stool per week.  Even if you have a regular bowel pattern at home, your normal regimen is likely to be disrupted due to multiple reasons following surgery.  Combination of anesthesia, postoperative narcotics, change in appetite and fluid intake all can affect your bowels.   YOU MUST use at least one of the following options; they are listed in order of increasing strength to get the job done.  They are all available over the counter, and you may need to use some, POSSIBLY even all of these options:    Drink plenty of fluids (prune juice may be helpful) and high fiber foods Colace 100 mg by mouth twice a day  Senokot for constipation as directed and as needed Dulcolax (bisacodyl), take with full glass of water  Miralax (polyethylene glycol) once or twice a day as needed.  If you  have tried all these things and are unable to have a bowel movement in the first 3-4 days after surgery call either your surgeon or your primary doctor.    If you experience loose stools or diarrhea, hold the medications until you stool forms back up.  If your symptoms do not get better within 1 week or if they get worse, check with your doctor.  If you experience "the worst abdominal pain ever" or develop nausea or vomiting, please contact the office immediately for further recommendations for treatment.  ITCHING:  If you experience itching with your medications, try taking only a single pain pill, or even half a pain pill at a time.  You can also use Benadryl over the counter for itching or also to help with sleep.   TED HOSE STOCKINGS:  Use stockings on both legs until for at least 2 weeks or  as directed by physician office. They may be removed at night for sleeping.  MEDICATIONS:  See your medication summary on the "After Visit Summary" that nursing will review with you.  You may have some home medications which will be placed on hold until you complete the course of blood thinner medication.  It is important for you to complete the blood thinner medication as prescribed.  Blood clot prevention (DVT Prophylaxis): After surgery you are at an increased risk for a blood clot. you were prescribed a blood thinner, Aspirin 81mg , to be taken twice daily for a total of 4 weeks from surgery to help reduce your risk of getting a blood clot.  Signs of a pulmonary embolus (blood clot in the lungs) include sudden short of breath, feeling lightheaded or dizzy, chest pain with a deep breath, rapid pulse rapid breathing.  Signs of a blood clot in your arms or legs include new unexplained swelling and cramping, warm, red or darkened skin around the painful area.  Please call the office or 911 right away if these signs or symptoms develop.  PRECAUTIONS:   If you experience chest pain or shortness of breath - call 911  immediately for transfer to the hospital emergency department.   If you develop a fever greater that 101 F, purulent drainage from wound, increased redness or drainage from wound, foul odor from the wound/dressing, or calf pain - CONTACT YOUR SURGEON.                                                   FOLLOW-UP APPOINTMENTS:  If you do not already have a post-op appointment, please call the office for an appointment to be seen by your surgeon.  Guidelines for how soon to be seen are listed in your "After Visit Summary", but are typically between 2-3 weeks after surgery.  If you have a specialized bandage, you may be told to follow up 1 week after surgery.  POST-OPERATIVE OPIOID TAPER INSTRUCTIONS: It is important to wean off of your opioid medication as soon as possible. If you do not need pain medication after your surgery it is ok to stop day one. Opioids include: Codeine, Hydrocodone(Norco, Vicodin), Oxycodone(Percocet, oxycontin) and hydromorphone amongst others.  Long term and even short term use of opiods can cause: Increased pain response Dependence Constipation Depression Respiratory depression And more.  Withdrawal symptoms can include Flu like symptoms Nausea, vomiting And more Techniques to manage these symptoms Hydrate well Eat regular healthy meals Stay active Use relaxation techniques(deep breathing, meditating, yoga) Do Not substitute Alcohol to help with tapering If you have been on opioids for less than two weeks and do not have pain than it is ok to stop all together.  Plan to wean off of opioids This plan should start within one week post op of your joint replacement. Maintain the same interval or time between taking each dose and first decrease the dose.  Cut the total daily intake of opioids by one tablet each day Next start to increase the time between doses. The last dose that should be eliminated is the evening dose.   MAKE SURE YOU:  Understand these  instructions.  Get help right away if you are not doing well or get worse.    Thank you for letting us be a part of your medical care team.  It is a privilege we respect greatly.  We hope these instructions will help you stay on track for a fast and full recovery!

## 2023-12-17 ENCOUNTER — Encounter (HOSPITAL_COMMUNITY): Payer: Self-pay | Admitting: Orthopedic Surgery

## 2023-12-17 ENCOUNTER — Other Ambulatory Visit (HOSPITAL_COMMUNITY): Payer: Self-pay

## 2023-12-17 DIAGNOSIS — K760 Fatty (change of) liver, not elsewhere classified: Secondary | ICD-10-CM

## 2023-12-17 DIAGNOSIS — R739 Hyperglycemia, unspecified: Secondary | ICD-10-CM | POA: Diagnosis not present

## 2023-12-17 DIAGNOSIS — D72829 Elevated white blood cell count, unspecified: Secondary | ICD-10-CM | POA: Diagnosis not present

## 2023-12-17 DIAGNOSIS — I10 Essential (primary) hypertension: Secondary | ICD-10-CM | POA: Diagnosis not present

## 2023-12-17 DIAGNOSIS — S72001A Fracture of unspecified part of neck of right femur, initial encounter for closed fracture: Secondary | ICD-10-CM | POA: Diagnosis not present

## 2023-12-17 LAB — CBC
HCT: 40.9 % (ref 39.0–52.0)
Hemoglobin: 14.1 g/dL (ref 13.0–17.0)
MCH: 33.3 pg (ref 26.0–34.0)
MCHC: 34.5 g/dL (ref 30.0–36.0)
MCV: 96.7 fL (ref 80.0–100.0)
Platelets: 169 10*3/uL (ref 150–400)
RBC: 4.23 MIL/uL (ref 4.22–5.81)
RDW: 12.8 % (ref 11.5–15.5)
WBC: 16.3 10*3/uL — ABNORMAL HIGH (ref 4.0–10.5)
nRBC: 0 % (ref 0.0–0.2)

## 2023-12-17 LAB — COMPREHENSIVE METABOLIC PANEL WITH GFR
ALT: 76 U/L — ABNORMAL HIGH (ref 0–44)
AST: 56 U/L — ABNORMAL HIGH (ref 15–41)
Albumin: 3.3 g/dL — ABNORMAL LOW (ref 3.5–5.0)
Alkaline Phosphatase: 64 U/L (ref 38–126)
Anion gap: 6 (ref 5–15)
BUN: 14 mg/dL (ref 6–20)
CO2: 28 mmol/L (ref 22–32)
Calcium: 8.6 mg/dL — ABNORMAL LOW (ref 8.9–10.3)
Chloride: 102 mmol/L (ref 98–111)
Creatinine, Ser: 0.76 mg/dL (ref 0.61–1.24)
GFR, Estimated: >60 mL/min (ref >60)
Glucose, Bld: 124 mg/dL — ABNORMAL HIGH (ref 70–99)
Potassium: 4.6 mmol/L (ref 3.5–5.1)
Sodium: 136 mmol/L (ref 135–145)
Total Bilirubin: 1.1 mg/dL (ref 0.0–1.2)
Total Protein: 6.4 g/dL — ABNORMAL LOW (ref 6.5–8.1)

## 2023-12-17 MED ORDER — PRAVASTATIN SODIUM 20 MG PO TABS: 40.0000 mg | ORAL_TABLET | Freq: Every day | ORAL | Status: DC

## 2023-12-17 MED ORDER — LISINOPRIL 20 MG PO TABS
40.0000 mg | ORAL_TABLET | Freq: Every day | ORAL | Status: DC
  Administered 2023-12-17: 40 mg via ORAL
  Filled 2023-12-17: qty 2

## 2023-12-17 MED ORDER — FLUOXETINE HCL 10 MG PO CAPS
10.0000 mg | ORAL_CAPSULE | Freq: Every day | ORAL | Status: DC
  Administered 2023-12-17: 10 mg via ORAL
  Filled 2023-12-17: qty 1

## 2023-12-17 MED ORDER — BUPROPION HCL ER (XL) 150 MG PO TB24
150.0000 mg | ORAL_TABLET | Freq: Every day | ORAL | Status: DC
  Administered 2023-12-17: 150 mg via ORAL
  Filled 2023-12-17: qty 1

## 2023-12-17 MED ORDER — OXYCODONE HCL 5 MG PO TABS
5.0000 mg | ORAL_TABLET | ORAL | 0 refills | Status: DC | PRN
Start: 2023-12-17 — End: 2023-12-17

## 2023-12-17 MED ORDER — OXYCODONE HCL 5 MG PO TABS
5.0000 mg | ORAL_TABLET | ORAL | 0 refills | Status: AC | PRN
  Filled 2023-12-17: qty 30, 5d supply, fill #0

## 2023-12-17 MED ORDER — ASPIRIN 81 MG PO TBEC: 81.0000 mg | DELAYED_RELEASE_TABLET | Freq: Two times a day (BID) | ORAL | Status: DC

## 2023-12-17 MED ORDER — POLYETHYLENE GLYCOL 3350 17 G PO PACK
17.0000 g | PACK | Freq: Every day | ORAL | Status: DC
  Administered 2023-12-17: 17 g via ORAL
  Filled 2023-12-17: qty 1

## 2023-12-17 MED ORDER — AMLODIPINE BESYLATE 10 MG PO TABS: 10.0000 mg | ORAL_TABLET | Freq: Every day | ORAL | Status: DC

## 2023-12-17 MED ORDER — TRAZODONE HCL 100 MG PO TABS: 100.0000 mg | ORAL_TABLET | Freq: Every evening | ORAL | Status: DC | PRN

## 2023-12-17 MED ORDER — ASPIRIN 81 MG PO TBEC
81.0000 mg | DELAYED_RELEASE_TABLET | Freq: Two times a day (BID) | ORAL | 0 refills | Status: AC
  Filled 2023-12-17: qty 56, 28d supply, fill #0

## 2023-12-17 MED ORDER — OXYCODONE HCL 5 MG PO TABS
5.0000 mg | ORAL_TABLET | ORAL | Status: DC | PRN
  Administered 2023-12-17 (×2): 10 mg via ORAL
  Filled 2023-12-17 (×2): qty 2

## 2023-12-17 NOTE — Evaluation (Signed)
 Physical Therapy Evaluation Patient Details Name: Tristan Gray MRN: [id number] DOB: March 25, 1961 Today's Date: 12/17/2023  History of Present Illness  63 yo male who was washing his car at home, slipped, fell on his right hip  developing immediate pain and inability to bear weight on his RLE. Right hip x-ray showing displaced right femoral neck fracture.ortho consulted. pt is now s/p R posterior THA.   PMH:  nephrolithiasis, hypertension, chronic back pain, lumbar radiculopathy, history incarcerated ventral hernia, hyperlipidemia, elevated liver enzymes, generalized anxiety disorder  Clinical Impression  Patient evaluated by Physical Therapy with no further acute PT needs identified. All education has been completed and the patient has no further questions.  Pt motivated to d/c home today. Pt amb ~ 79' with RW and supervision, able ascend/descend 2 steps posterior technique, spouse present for training. Reviewed ankle pumps on L, gentle knee/hip ROM. Plan is for HHPT. Pt is ready to d/c from PT standpoint with spouse assisting as needed   See below for any follow-up Physical Therapy or equipment needs. PT is signing off. Thank you for this referral.         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; typically wears brace when amb longer distances Restrictions RLE Weight Bearing Per Provider Order: Weight bearing as tolerated      Mobility  Bed Mobility               General bed mobility comments: reviewed technique  verbally with pt and wife    Transfers Overall transfer level: Needs assistance Equipment used: Rolling walker (2 wheels) Transfers: Sit to/from Stand Sit to Stand: Contact guard assist, Supervision           General transfer comment: cues for hand placement and RLE position    Ambulation/Gait Ambulation/Gait assistance: Contact guard assist, Supervision Gait Distance (Feet): 70 Feet Assistive device: Rolling walker (2 wheels) Gait Pattern/deviations: Step-to pattern, Decreased stance time - right, Decreased weight shift to right       General Gait Details: cues for sequence, off loading RLE for pain control, RW position  Stairs Stairs: Yes Stairs assistance: Min assist Stair Management: No rails, Step to pattern, Backwards, With walker Number of Stairs: 2 General stair comments: cues for sequence and RW position. wife present and able to assist  Wheelchair Mobility     Tilt Bed    Modified Rankin (Stroke Patients Only)       Balance Overall balance assessment: History of Falls, Needs assistance Sitting-balance support: Feet supported, No upper extremity supported Sitting balance-Leahy Scale: Fair     Standing balance support: Reliant on assistive device for balance, During functional activity, No upper extremity supported Standing balance-Leahy Scale: Fair Standing balance comment: able to static stand without UE support                             Pertinent Vitals/Pain Pain Assessment Pain Assessment: 0-10 Pain Score: 7  Pain Location: R hip with activity Pain Descriptors / Indicators: Sore, Grimacing, Guarding Pain Intervention(s):  Limited activity within patient's tolerance, Monitored during session, Premedicated before session    Home Living Family/patient expects to be discharged to:: Private residence   Available Help at Discharge: Family;Available 24 hours/day Type of Home: House Home Access: Stairs to enter Entrance  Stairs-Rails: None Entrance Stairs-Number of Steps: 2   Home Layout: One level Home Equipment: Agricultural consultant (2 wheels) Additional Comments: pt and spouse work from home    Prior Function Prior Level of Function : Independent/Modified Independent                     Extremity/Trunk Assessment   Upper Extremity Assessment Upper Extremity Assessment: Overall WFL for tasks assessed    Lower Extremity Assessment Lower Extremity Assessment: RLE deficits/detail RLE Deficits / Details: drop foot at baseline; hip limied d/t pain and post op weakness RLE: Unable to fully assess due to pain       Communication   Communication Communication: No apparent difficulties    Cognition Arousal: Alert Behavior During Therapy: WFL for tasks assessed/performed   PT - Cognitive impairments: No apparent impairments                         Following commands: Intact       Cueing Cueing Techniques: Verbal cues     General Comments      Exercises Total Joint Exercises Ankle Circles/Pumps: AROM, Left, 5 reps   Assessment/Plan    PT Assessment All further PT needs can be met in the next venue of care  PT Problem List         PT Treatment Interventions      PT Goals (Current goals can be found in the Care Plan section)  Acute Rehab PT Goals PT Goal Formulation: All assessment and education complete, DC therapy    Frequency       Co-evaluation               AM-PAC PT "6 Clicks" Mobility  Outcome Measure                  End of Session Equipment Utilized During Treatment: Gait belt Activity Tolerance: Patient tolerated treatment well Patient left: with call bell/phone within reach;in chair;with family/visitor present   PT Visit Diagnosis: Other abnormalities of gait and mobility (R26.89);Difficulty in walking, not elsewhere classified (R26.2)    Time:  -      Charges:                 Alexandria Ida, PT  Acute Rehab Dept [facility])  [phone]  12/17/2023   [facility] 12/17/2023, 3:37 PM

## 2023-12-17 NOTE — Plan of Care (Signed)

## 2023-12-17 NOTE — Progress Notes (Signed)
 AVS reviewed w/ pt   wife who verbalized an understanding. HH PT has not been set up. TOC will call pt tomorrow if they are able to set up [facility] PT, if not pt will follow up w/ the surgeon to start PT. No other questions at this time. Pt dressed for d/c to home. Pt to lobby via w/c- pt home w/ walker.

## 2023-12-17 NOTE — Progress Notes (Signed)
     Subjective: Patient reports pain as mild.  Feels much better this AM. Weakness and numbness in RLE is at baseline. Plan for PT today. Possible dc home later today vs tomorrow.  Objective:   VITALS:   Vitals:   12/16/23 1730 12/16/23 1745 12/16/23 1944 12/16/23 2346  BP: (!) 119/93 (!) 131/98 117/77 113/78  Pulse: 77 86 93 87  Resp: 11 18 16 17   Temp:  98.8 F (37.1 C) 98.1 F (36.7 C) 98.3 F (36.8 C)  TempSrc:  Oral Oral Oral  SpO2: 96% 98% 94% 97%  Weight:      Height:        Intact pulses distally Incision: dressing C/D/I Compartment soft Decree sensation in dorsiflexion plantarflexion at baseline from chronic sciatic nerve palsy  Lab Results  Component Value Date   WBC 16.3 (H) 12/17/2023   HGB 14.1 12/17/2023   HCT 40.9 12/17/2023   MCV 96.7 12/17/2023   PLT 169 12/17/2023   BMET    Component Value Date/Time   NA 136 12/17/2023 0317   K 4.6 12/17/2023 0317   CL 102 12/17/2023 0317   CO2 28 12/17/2023 0317   GLUCOSE 124 (H) 12/17/2023 0317   BUN 14 12/17/2023 0317   CREATININE 0.76 12/17/2023 0317   CALCIUM 8.6 (L) 12/17/2023 0317   GFRNONAA >60 12/17/2023 0317      Xray: THA components good position no adverse features  Assessment/Plan: 1 Day Post-Op   Principal Problem:   Closed right hip fracture, initial encounter [facility]) Active Problems:   Lumbar radiculopathy   Elevated liver enzymes   Essential hypertension   Generalized anxiety disorder   Hyperlipidemia   Chronic back pain   Hepatic steatosis   Leukocytosis   Hyperglycemia   Alcoholic liver disease (HCC)  Status post right THA for femoral neck fracture 12/16/2023  Post op recs: WB: WBAT RLE, No formal hip precautions Abx: ancef Imaging: PACU pelvis Xray Dressing: Aquacell, keep intact until follow up DVT prophylaxis: Aspirin 81BID starting POD1 Follow up: 2 weeks after surgery for a wound check with Dr. Pryor Browning at [facility].  Address: [address]  [address], [place], [place] [address]  Office Phone: [phone]      Murleen Arms 12/17/2023, 6:19 AM   Priscille Brought, MD  Contact information:   [phone] 7am-5pm epic message Dr. Pryor Browning, or call office for patient follow up: [phone] After hours and holidays please check [email or web address] for group call information for [facility]

## 2023-12-17 NOTE — Discharge Summary (Signed)
 Physician Discharge Summary   Patient: Tristan Gray MRN: [id number] DOB: [birth year]  Admit date:     12/15/2023  Discharge date: 12/17/23  Discharge Physician: Aisha Hove   PCP: Chandler Combs, MD   Recommendations at discharge:    PCP follow up in 1 week. CBC. LFT check next week Orthopedic surgery follow up in 2 weeks as scheduled.  Discharge Diagnoses: Principal Problem:   Closed right hip fracture, initial encounter [facility]) Active Problems:   Lumbar radiculopathy   Elevated liver enzymes   Essential hypertension   Generalized anxiety disorder   Hyperlipidemia   Chronic back pain   Hepatic steatosis   Leukocytosis   Hyperglycemia   Alcoholic liver disease (HCC)  Resolved Problems:   * No resolved hospital problems. *  Hospital Course: Tristan Gray is a 63 y.o. male with medical history significant of nephrolithiasis, hypertension, chronic back pain, lumbar radiculopathy, history incarcerated ventral hernia, hyperlipidemia, elevated liver enzymes, generalized anxiety disorder who was washing his car at home, slipped, fell on his right hip area developing immediate pain and inability to bear weight on his RLE. Right hip x-ray showing displaced right femoral neck fracture. Bilateral hip arthritic changes. Lower lumbar fusion seen and soft tissues were unremarkable. He is admitted to [facility] service with orthopedic surgery consult.   Assessment and Plan: Closed right hip fracture, initial encounter [facility]) S/p right THA for femoral neck fracture 12/16/2023  Pain better controlled. He is able to ambulate well per PT. Ortho follow up in 2 weeks. Oxycodone  for pain, Aspirin 81 bid for DVT prescribed. Aquacell dressing, keep intact until follow up.   Chronic back pain Lumbar radiculopathy Outpatient PCP follow up for pain control.   Elevated liver enzymes Hepatic steatosis Alcoholic liver disease (HCC) Alcohol cessation advised. No withdrawal symptoms  noted.  Essential hypertension Continue amlodipine  10 mg p.o. bedtime. Continue Lisinopril  40 mg p.o. daily.   Generalized anxiety disorder Continue bupropion  and fluoxetine .   Hyperlipidemia Hold pravastatin  due to transaminitis. Recheck LFT next week with PCP.   Leukocytosis- improved. Post surgery, likely reactive. Outpatient CBC in 1 week.   Hyperglycemia Resolved. A1c 4.9.      Consultants: orthopedic surgery Procedures performed: right THA for femoral neck fracture 12/16/2023  Disposition: Home Diet recommendation:  Discharge Diet Orders (From admission, onward)     Start     Ordered   12/17/23 0000  Diet - low sodium heart healthy        12/17/23 1525           Cardiac diet DISCHARGE MEDICATION: Allergies as of 12/17/2023       Reactions   Prednisone Swelling   Penicillins    Childhood  reaction        Medication List     PAUSE taking these medications    pravastatin  40 MG tablet Wait to take this until: Dec 24, 2023 Commonly known as: PRAVACHOL  Take 40 mg by mouth at bedtime.       STOP taking these medications    oxyCODONE -acetaminophen  5-325 MG tablet Commonly known as: PERCOCET/ROXICET       TAKE these medications    amLODipine  10 MG tablet Commonly known as: NORVASC  Take 10 mg by mouth at bedtime.   aspirin EC 81 MG tablet Take 1 tablet (81 mg total) by mouth 2 (two) times daily for 28 days. Swallow whole.   buPROPion  150 MG 24 hr tablet Commonly known as: WELLBUTRIN  XL Take 150 mg by mouth daily.  FLUoxetine  10 MG capsule Commonly known as: PROZAC  Take 10 mg by mouth daily.   lisinopril  40 MG tablet Commonly known as: ZESTRIL  Take 40 mg by mouth daily.   MULTIVITAMIN PO Take 1 tablet by mouth daily.   oxyCODONE  5 MG immediate release tablet Commonly known as: Roxicodone  Take 1 tablet (5 mg total) by mouth every 4 (four) hours as needed for up to 7 days for severe pain (pain score 7-10) or moderate pain (pain  score 4-6).   traZODone  100 MG tablet Commonly known as: DESYREL  Take 100 mg by mouth at bedtime as needed (sleep.).        Follow-up Information     Murleen Arms, MD Follow up in 2 week(s).   Specialty: Orthopedic Surgery Contact information: [address] [place] [address] [place] [address] [phone]                Discharge Exam: Tristan Gray Weights   12/15/23 1545 12/16/23 1236  Weight: 91.2 kg 91.2 kg      12/17/2023    2:04 PM 12/17/2023    9:36 AM 12/17/2023    6:40 AM  Vitals with BMI  Systolic 103 115 761  Diastolic 63 83 89  Pulse 90 95 92    General - Middle aged Caucasian male, no acute distress. HEENT - PERRLA, EOMI, atraumatic head, non tender sinuses. Lung - Clear, no rales, rhonchi, wheezes. Heart - S1, S2 heard, no murmurs, rubs, trace pedal edema. Abdomen - Soft, non tender, bowel sounds good Neuro - Alert, awake and oriented x 3, non focal exam. Skin - Warm and dry. Left foot drop. Right hip dressing noted  Condition at discharge: stable  The results of significant diagnostics from this hospitalization (including imaging, microbiology, ancillary and laboratory) are listed below for reference.   Imaging Studies: DG Pelvis Portable Result Date: 12/16/2023 CLINICAL DATA:  Intra operative radiograph right hip arthroplasty. EXAM: PORTABLE PELVIS 1-2 VIEWS COMPARISON:  Pelvis and right hip radiographs 12/15/2023 FINDINGS: The patient is undergoing total right hip arthroplasty. No hardware complication is seen. Partial visualization of lumbosacral bilateral transpedicular rod and screw fusion hardware. Moderate right femoroacetabular osteoarthritis. IMPRESSION: Intraoperative radiograph during total right hip arthroplasty. Electronically Signed   By: Bertina Broccoli M.D.   On: 12/16/2023 18:25   DG HIP UNILAT W OR W/O PELVIS 2-3 VIEWS RIGHT Result Date: 12/16/2023 CLINICAL DATA:  Postop. EXAM: DG HIP (WITH OR WITHOUT PELVIS) 2-3V RIGHT COMPARISON:  None  Available. FINDINGS: Right hip arthroplasty in expected alignment. No periprosthetic lucency or fracture. Recent postsurgical change includes air and edema in the soft tissues. IMPRESSION: Right hip arthroplasty without immediate postoperative complication. Electronically Signed   By: Chadwick Colonel M.D.   On: 12/16/2023 17:00   DG Chest 1 View Result Date: 12/15/2023 CLINICAL DATA:  Fall. EXAM: CHEST  1 VIEW COMPARISON:  Chest radiograph dated 03/10/2007. FINDINGS: No focal consolidation, pleural effusion or pneumothorax. The cardiac silhouette is within normal limits. No acute osseous pathology. IMPRESSION: No active disease. Electronically Signed   By: Angus Bark M.D.   On: 12/15/2023 17:21   DG Hip Unilat  With Pelvis 2-3 Views Right Result Date: 12/15/2023 CLINICAL DATA:  Fall. EXAM: DG HIP (WITH OR WITHOUT PELVIS) 2-3V RIGHT COMPARISON:  None Available. FINDINGS: There is a displaced and impacted fracture of the right femoral neck. No dislocation. Mild bilateral hip arthritic changes. Lower lumbar fusion. The soft tissues are unremarkable. IMPRESSION: Displaced right femoral neck fracture. Electronically Signed  By: Angus Bark M.D.   On: 12/15/2023 17:21    Microbiology: Results for orders placed or performed during the hospital encounter of 12/15/23  Surgical PCR screen     Status: None   Collection Time: 12/15/23  8:39 PM   Specimen: Nasal Mucosa; Nasal Swab  Result Value Ref Range Status   MRSA, PCR NEGATIVE NEGATIVE Final   Staphylococcus aureus NEGATIVE NEGATIVE Final    Comment: (NOTE) The Xpert SA Assay (FDA approved for NASAL specimens in patients 29 years of age and older), is one component of a comprehensive surveillance program. It is not intended to diagnose infection nor to guide or monitor treatment. Performed at [facility], [address] [place], [place] [address]     Labs: CBC: Recent Labs  Lab 12/15/23 1622 12/16/23 0539  12/17/23 0317  WBC 13.7* 9.2 16.3*  NEUTROABS 10.4*  --   --   HGB 15.5 15.3 14.1  HCT 45.9 42.9 40.9  MCV 96.2 95.3 96.7  PLT 200 148* 169   Basic Metabolic Panel: Recent Labs  Lab 12/15/23 1622 12/16/23 0539 12/17/23 0317  NA 138 135 136  K 4.0 3.5 4.6  CL 105 104 102  CO2 23 23 28   GLUCOSE 121* 107* 124*  BUN 17 14 14   CREATININE 0.74 0.62 0.76  CALCIUM 9.1 8.4* 8.6*  MG 2.1  --   --   PHOS 2.2* 3.2  --    Liver Function Tests: Recent Labs  Lab 12/15/23 1622 12/16/23 0539 12/17/23 0317  AST 86* 60* 56*  ALT 104* 88* 76*  ALKPHOS 69 67 64  BILITOT 0.8 1.4* 1.1  PROT 7.1 6.6 6.4*  ALBUMIN  3.9 3.5 3.3*   CBG: No results for input(s): "GLUCAP" in the last 168 hours.  Discharge time spent: 36 minutes.  Signed: Aisha Hove, MD [facility] 12/17/2023

## 2023-12-18 NOTE — TOC Transition Note (Signed)
 Transition of Care [facility]) - Discharge Note   Patient Details  Name: Tristan Gray MRN: [id number] Date of Birth: [birth year]  Transition of Care [facility]) CM/SW Contact:  Delilah Fend, LCSW Phone Number: 12/18/2023, 10:08 AM   Clinical Narrative:     Pt dc'd home end of day yesterday and HHPT orders had been placed.  Had not been able to secure [facility] prior to pt leaving the hospital but able to make referral/ accepted with [facility] this morning.  They will reach out to pt and anticipate first visit tomorrow.  Have left VM for pt/ wife.  Pt, also, needing RW and had no DME agency preference.  RW ordered with [employer] yesterday and delivered to room prior to his discharge.    Final next level of care: Home w Home Health Services Barriers to Discharge: Barriers Resolved   Patient Goals and CMS Choice Patient states their goals for this hospitalization and ongoing recovery are:: return home          Discharge Placement                       Discharge Plan and Services Additional resources added to the After Visit Summary for   In-house Referral: Clinical Social Work              DME Arranged: Otho Blitz rolling DME Agency: [employer] Date DME Agency Contacted: 12/17/23 Time DME Agency Contacted: 0900 Representative spoke with at DME Agency: Joleen Navy HH Arranged: PT HH Agency: [facility] Date [facility] Agency Contacted: 12/18/23 Time HH Agency Contacted: 1005 Representative spoke with at [facility] Agency: [facility]  Social Drivers of Health (SDOH) Interventions SDOH Screenings   Food Insecurity: No Food Insecurity (12/15/2023)  Housing: Low Risk  (12/15/2023)  Transportation Needs: No Transportation Needs (12/15/2023)  Utilities: Not At Risk (12/15/2023)  Financial Resource Strain: Low Risk  (10/01/2023)   Received from [facility]  Physical Activity: Insufficiently Active (10/01/2023)   Received from [facility]  Social Connections: Socially Integrated (10/01/2023)   Received  from [facility]  Stress: Stress Concern Present (10/01/2023)   Received from [facility]  Tobacco Use: Medium Risk (12/16/2023)     Readmission Risk Interventions    12/16/2023   10:38 AM  Readmission Risk Prevention Plan  Post Dischage Appt Complete  Medication Screening Complete  Transportation Screening Complete

## 2023-12-23 ENCOUNTER — Other Ambulatory Visit (HOSPITAL_COMMUNITY): Payer: Self-pay

## 2024-02-05 NOTE — Progress Notes (Signed)
 History of Present Illness The patient is a 63 year old male who presents for preoperative clearance for a left total knee arthroplasty.  He is scheduled for a left total knee arthroplasty, with the date to be determined. He anticipates undergoing knee surgery in the first or second week of 03/2024, following a visit to his father after 02/14/2024. He reports experiencing fatigue related to his surgical history. He has expressed concerns about potential adverse effects from frequent anesthesia exposure.  He has previously undergone surgery for a left posterior neck mass on 11/27/2023 and a total hip arthroplasty of the right hip on 12/16/2023. Additionally, he had surgery for a herniated belly button in either the last week of 07/2023 or the first week of 08/2023. He has had 3 surgeries in 5 months. He reports no current issues with his hip but experiences daily back pain. He also notes mild pain at the surgical scar site and has been advised to avoid swimming pools until 02/2024. He continues to take oxycodone  as needed, typically half a tablet in the morning and half in the afternoon, but not daily. He also engages in regular walking and stretching exercises.  He has made dietary modifications and reduced his alcohol  intake, now consuming only a few drinks on weekends compared to his previous habit of 2 to 3 drinks nightly. His liver enzymes have shown improvement since his last evaluation. He underwent a liver ultrasound but does not recall the results. He was informed that continued alcohol  consumption could lead to cirrhosis.  He has been diagnosed with an aortic aneurysm.  PAST SURGICAL HISTORY: - Left posterior neck mass excision on 11/27/2023 - Right total hip arthroplasty on 12/16/2023 - Herniated belly button repair in either the last week of 07/2023 or the first week of 08/2023  SOCIAL HISTORY He reports drinking only a few drinks on the weekend, which is a reduction from his previous  habit of drinking 2 or 3 drinks every night.  FAMILY HISTORY His father is 7 years old and has had one surgery in his life. His mother died at 51 from pancreatic liver cancer due to smoking.  Vitals:   02/05/24 1108  BP: 130/82  Pulse:   Resp:   Temp:   SpO2:    Gen: Alert, oriented, non toxic, and well hydrated.  No signs of acute distress. HEENT: Normocephalic.  Atraumatic.  PERRLA.   Neck: Supple. No lymphadenopathy Respiratory:  No use of accessory muscles. Cardiovascular: Regular rate and rhythm.   Abdominal:  Soft, non tender, non distended.    Neuro: Cranial nerves intact grossly.  Right foot drop secondary to lumbar degenerative disease. S/P surgery.  MS:  Decreased range of motion right hip, left knee, lumbar spine. No cyanosis, clubbing, or edema. Skin:  No rashes noted Psych: Oriented, alert.  Assessment & Plan 1. Preoperative clearance for left total knee arthroplasty. - Scheduled for surgery in the first or second week of August. - EKG performed today shows normal sinus rhythm. - Basic blood work will be conducted. - Clearance for surgery pending satisfactory lab results.  2. Alcoholic liver disease. - Alcohol  consumption has significantly decreased. - Liver function tests have shown improvement. - Continued monitoring of liver function tests necessary. - Previous ultrasound of the liver performed, results not recalled by the patient.  3. Aortic aneurysm. - Diagnosed with an aortic aneurysm. - Condition will be monitored for size and progression. - Further intervention may be required based on monitoring results. - Patient educated on the  nature of the aneurysm and potential risks.  4. Pain management. - Currently taking oxycodone  as needed, approximately one tablet per day. - Gradual weaning off oxycodone  in progress. - Hip pain reported as fine; back pain persists daily. - Scar pain noted, advised to avoid pool activities until July.  Patient   verbalized to me that they understood what their problem is, what they need to do about it, and why it is important that they do it.  The patient/family voices understanding of all medications. No barriers to adherence were noted. Patient is taking all medications as prescribed and is tolerating well.  Plan for follow-up as discussed or as needed if any worsening symptoms or change in condition.

## 2024-03-11 ENCOUNTER — Other Ambulatory Visit (HOSPITAL_COMMUNITY): Payer: Self-pay | Admitting: Emergency Medicine

## 2024-03-11 ENCOUNTER — Ambulatory Visit (HOSPITAL_COMMUNITY)
Admission: RE | Admit: 2024-03-11 | Discharge: 2024-03-11 | Disposition: A | Discharge status: Home / Self Care | Source: Ambulatory Visit | Attending: Vascular Surgery | Admitting: Vascular Surgery

## 2024-03-11 ENCOUNTER — Other Ambulatory Visit (HOSPITAL_COMMUNITY): Payer: Self-pay

## 2024-03-11 ENCOUNTER — Ambulatory Visit: Attending: Vascular Surgery | Admitting: Student-PharmD

## 2024-03-11 ENCOUNTER — Encounter: Payer: Self-pay | Admitting: Student-PharmD

## 2024-03-11 ENCOUNTER — Telehealth: Payer: Self-pay

## 2024-03-11 VITALS — BP 109/78 | HR 91

## 2024-03-11 DIAGNOSIS — M79605 Pain in left leg: Secondary | ICD-10-CM | POA: Insufficient documentation

## 2024-03-11 DIAGNOSIS — I82452 Acute embolism and thrombosis of left peroneal vein: Secondary | ICD-10-CM

## 2024-03-11 MED ORDER — APIXABAN 5 MG PO TABS: 5.0000 mg | ORAL_TABLET | Freq: Two times a day (BID) | ORAL | 1 refills | Status: DC

## 2024-03-11 MED ORDER — APIXABAN (ELIQUIS) VTE STARTER PACK (10MG AND 5MG)
ORAL_TABLET | ORAL | 0 refills | Status: DC
  Filled 2024-03-11: qty 74, 30d supply, fill #0

## 2024-03-11 NOTE — Progress Notes (Signed)
 DVT Clinic Note  Name: Tristan Gray     MRN: [id number]     DOB: 1961-05-23     Sex: male  PCP: Bobbette Coye LABOR, MD  Today's Visit: Visit Information: Initial Visit  Referred to DVT Clinic by: Orthopedic Surgery - Dr. Edna Referred to CPP by: Dr. Magda Reason for referral:  Chief Complaint  Patient presents with   DVT   HISTORY OF PRESENT ILLNESS: Tristan Gray is a 63 y.o. male with PMH HTN, HLD, hepatic steatosis, generalized anxiety disorder, who presents after diagnosis of DVT for medication management. Patient is s/p right THA for femoral neck fracture 12/16/23. Then had left TKA on 03/06/24. Developed pain and swelling in the left leg immediately after surgery. Went to PT yesterday who was concerned for DVT. Ultrasound today showed age indeterminate DVT in the left posterior tibial and peroneal veins. Referred to DVT Clinic to start treatment. Reports he was started on aspirin  81 mg BID after the knee surgery to prevent blood clots which he has been taking. He has been elevating his leg and wearing compression stockings. Ambulating with use of a walker and accompanied by wife Damien. No prior history of DVT.   Positive Thrombotic Risk Factors: Recent surgery (within 3 months) Bleeding Risk Factors: None Present  Negative Thrombotic Risk Factors: Previous VTE, Recent trauma (within 3 months), Recent admission to hospital with acute illness (within 3 months), Paralysis, paresis, or recent plaster cast immobilization of lower extremity, Central venous catheterization, Bed rest >72 hours within 3 months, Sedentary journey lasting >8 hours within 4 weeks, Pregnancy, Within 6 weeks postpartum, Recent cesarean section (within 3 months), Estrogen therapy, Testosterone therapy, Erythropoiesis-stimulating agent, Recent COVID diagnosis (within 3 months), Active cancer, Non-malignant, chronic inflammatory condition, Known thrombophilic condition, Smoking, Obesity, Older age  Rx Insurance  Coverage: Commercial Rx Affordability: Eliquis  is $0/month, limited to 30 day supply Rx Assistance Provided: None needed at this time Preferred Pharmacy: Starter pack filled at [facility]. [facility].   Past Medical History:  Diagnosis Date   History of kidney stones    Hypertension     Past Surgical History:  Procedure Laterality Date   LUMBAR DISC SURGERY  2008   TOTAL HIP ARTHROPLASTY Right 12/16/2023   Procedure: ARTHROPLASTY, HIP, TOTAL,POSTERIOR APPROACH;  Surgeon: Edna Toribio LABOR, MD;  Location: [facility];  Service: Orthopedics;  Laterality: Right;    Social History   Socioeconomic History   Marital status: Married    Spouse name: Not on file   Number of children: Not on file   Years of education: Not on file   Highest education level: Not on file  Occupational History   Not on file  Tobacco Use   Smoking status: Former   Smokeless tobacco: Never  Vaping Use   Vaping status: Never Used  Substance and Sexual Activity   Alcohol  use: Yes    Comment: socially   Drug use: Never   Sexual activity: Not on file  Other Topics Concern   Not on file  Social History Narrative   Not on file   Social Drivers of Health   Financial Resource Strain: Low Risk  (10/01/2023)   Received from [facility]   Overall Financial Resource Strain (CARDIA)    Difficulty of Paying Living Expenses: Not very hard  Food Insecurity: No Food Insecurity (12/15/2023)   Hunger Vital Sign    Worried About Running Out of Food in the Last Year: Never true  Ran Out of Food in the Last Year: Never true  Transportation Needs: No Transportation Needs (12/15/2023)   PRAPARE - Administrator, Civil Service (Medical): No    Lack of Transportation (Non-Medical): No  Physical Activity: Insufficiently Active (10/01/2023)   Received from [facility]   Exercise Vital Sign    On average, how many days per week do you engage in moderate to strenuous exercise (like a  brisk walk)?: 4 days    On average, how many minutes do you engage in exercise at this level?: 30 min  Stress: Stress Concern Present (10/01/2023)   Received from [facility] - Occupational Stress Questionnaire    Feeling of Stress : To some extent  Social Connections: Socially Integrated (10/01/2023)   Received from [facility]   Social Network    How would you rate your social network (family, work, friends)?: Good participation with social networks  Intimate Partner Violence: Not At Risk (12/15/2023)   Humiliation, Afraid, Rape, and Kick questionnaire    Fear of Current or Ex-Partner: No    Emotionally Abused: No    Physically Abused: No    Sexually Abused: No    Family History  Problem Relation Age of Onset   Pancreatic cancer Mother    Emphysema Sister     Allergies as of 03/11/2024 - Review Complete 03/11/2024  Allergen Reaction Noted   Prednisone Swelling 06/06/2017   Penicillins  04/19/2012    Current Outpatient Medications on File Prior to Visit  Medication Sig Dispense Refill   amLODipine  (NORVASC ) 10 MG tablet Take 10 mg by mouth at bedtime.      buPROPion  (WELLBUTRIN  XL) 150 MG 24 hr tablet Take 150 mg by mouth daily.      FLUoxetine  (PROZAC ) 10 MG capsule Take 10 mg by mouth daily.     lisinopril  (ZESTRIL ) 40 MG tablet Take 40 mg by mouth daily.     methocarbamol  (ROBAXIN ) 500 MG tablet Take 500 mg by mouth every 6 (six) hours as needed.     Multiple Vitamin (MULTIVITAMIN PO) Take 1 tablet by mouth daily.     ondansetron  (ZOFRAN -ODT) 4 MG disintegrating tablet Take 4 mg by mouth every 8 (eight) hours as needed.     oxyCODONE  (OXY IR/ROXICODONE ) 5 MG immediate release tablet Take 5 mg by mouth every 4 (four) hours as needed.     oxyCODONE -acetaminophen  (PERCOCET/ROXICET) 5-325 MG tablet Take 1 tablet by mouth daily as needed.     pravastatin  (PRAVACHOL ) 40 MG tablet Take 40 mg by mouth at bedtime.     zolpidem (AMBIEN) 5 MG  tablet Take 5 mg by mouth at bedtime as needed.     No current facility-administered medications on file prior to visit.   REVIEW OF SYSTEMS:  Review of Systems  Respiratory:  Negative for shortness of breath.   Cardiovascular:  Positive for leg swelling. Negative for chest pain and palpitations.  Musculoskeletal:  Positive for joint pain and myalgias.  Neurological:  Negative for dizziness and tingling.   PHYSICAL EXAMINATION:  Vitals:   03/11/24 1321  BP: 109/78  Pulse: 91  SpO2: 96%    Physical Exam Vitals reviewed.  Cardiovascular:     Rate and Rhythm: Normal rate.  Pulmonary:     Effort: Pulmonary effort is normal.  Musculoskeletal:        General: Tenderness present.     Left lower leg: Edema present.  Skin:  Findings: No bruising or erythema.  Psychiatric:        Mood and Affect: Mood normal.        Behavior: Behavior normal.        Thought Content: Thought content normal.   LABS:  CBC     Component Value Date/Time   WBC 16.3 (H) 12/17/2023 0317   RBC 4.23 12/17/2023 0317   HGB 14.1 12/17/2023 0317   HCT 40.9 12/17/2023 0317   PLT 169 12/17/2023 0317   MCV 96.7 12/17/2023 0317   MCH 33.3 12/17/2023 0317   MCHC 34.5 12/17/2023 0317   RDW 12.8 12/17/2023 0317   LYMPHSABS 1.8 12/15/2023 1622   MONOABS 1.1 (H) 12/15/2023 1622   EOSABS 0.2 12/15/2023 1622   BASOSABS 0.1 12/15/2023 1622    Hepatic Function      Component Value Date/Time   PROT 6.4 (L) 12/17/2023 0317   ALBUMIN  3.3 (L) 12/17/2023 0317   AST 56 (H) 12/17/2023 0317   ALT 76 (H) 12/17/2023 0317   ALKPHOS 64 12/17/2023 0317   BILITOT 1.1 12/17/2023 0317    Renal Function   Lab Results  Component Value Date   CREATININE 0.76 12/17/2023   CREATININE 0.62 12/16/2023   CREATININE 0.74 12/15/2023    CrCl cannot be calculated (Patient's most recent lab result is older than the maximum 21 days allowed.).   VVS Vascular Lab Studies:  03/11/24 VAS US  LOWER EXTREMITY VENOUS LEFT (DVT)   Summary:  RIGHT:  - No evidence of common femoral vein obstruction.    LEFT:   - Findings consistent with age indeterminate deep vein thrombosis  involving the left posterior tibial veins, and left peroneal veins.    - No cystic structure found in the popliteal fossa.   ASSESSMENT: Location of DVT: Left distal vein Cause of DVT: provoked by a transient risk factor  Patient without prior history of DVT diagnosed with age-indeterminate DVT in the left posterior tibial and peroneal veins s/p left TKA on 03/06/24. Indicated to start anticoagulation. No concerns on labs for Eliquis  start. Will initiate VTE starter pack dosing. Counseled patient extensively on Eliquis . Will have him stop aspirin  which he was taking for DVT prophylaxis after surgery. Counseled to avoid NSAIDs. Will plan to treat first provoked DVT for a total of 3 months. No barriers to medication access or adherence identified today. All questions have been answered.   PLAN: -Stop aspirin  81 mg BID.  -Start apixaban  (Eliquis ) 10 mg twice daily for 7 days followed by 5 mg twice daily. -Expected duration of therapy: 3 months. Therapy started on 03/11/24. -Patient educated on purpose, proper use and potential adverse effects of apixaban  (Eliquis ). -Discussed importance of taking medication around the same time every day. -Advised patient of medications to avoid (NSAIDs, aspirin  doses >100 mg daily). -Educated that Tylenol  (acetaminophen ) is the preferred analgesic to lower the risk of bleeding. -Advised patient to alert all providers of anticoagulation therapy prior to starting a new medication or having a procedure. -Emphasized importance of monitoring for signs and symptoms of bleeding (abnormal bruising, prolonged bleeding, nose bleeds, bleeding from gums, discolored urine, black tarry stools). -Educated patient to present to the ED if emergent signs and symptoms of new thrombosis occur. -Counseled patient to wear compression  stockings daily, removing at night. Counseled on proper leg elevation.   Follow up: 3 months in DVT Clinic  Lum Herald, PharmD, [place], CPP Deep Vein Thrombosis Clinic Clinical Pharmacist Practitioner [phone]

## 2024-03-11 NOTE — Patient Instructions (Signed)
-  Start apixaban (Eliquis) 10 mg twice daily for 7 days followed by 5 mg twice daily. -Your [facility]. You may need to call the pharmacy to ask them to fill this when you start to run low on your current supply.  -It is important to take your medication around the same time every day.  -Avoid NSAIDs like ibuprofen (Advil, Motrin) and naproxen (Aleve) as well as aspirin  doses over 100 mg daily. -Tylenol  (acetaminophen ) is the preferred over the counter pain medication to lower the risk of bleeding. -Be sure to alert all of your health care providers that you are taking an anticoagulant prior to starting a new medication or having a procedure. -Monitor for signs and symptoms of bleeding (abnormal bruising, prolonged bleeding, nose bleeds, bleeding from gums, discolored urine, black tarry stools). If you have fallen and hit your head OR if your bleeding is severe or not stopping, seek emergency care.  -Go to the emergency room if emergent signs and symptoms of new clot occur (new or worse swelling and pain in an arm or leg, shortness of breath, chest pain, fast or irregular heartbeats, lightheadedness, dizziness, fainting, coughing up blood) or if you experience a significant color change (pale or blue) in the extremity that has the DVT.  -We recommend you wear compression stockings (20-30 mmHg) as long as you are having swelling or pain. Be sure to purchase the correct size and take them off at night.   If you have any questions or need to reschedule an appointment, please call [phone]. If you are having an emergency, call 911 or present to the nearest emergency room.   What is a DVT?  -Deep vein thrombosis (DVT) is a condition in which a blood clot forms in a vein of the deep venous system which can occur in the lower leg, thigh, pelvis, arm, or neck. This condition is serious and can be life-threatening if the clot travels to the arteries of the lungs and causing a blockage  (pulmonary embolism, PE). A DVT can also damage veins in the leg, which can lead to long-term venous disease, leg pain, swelling, discoloration, and ulcers or sores (post-thrombotic syndrome).  -Treatment may include taking an anticoagulant medication to prevent more clots from forming and the current clot from growing, wearing compression stockings, and/or surgical procedures to remove or dissolve the clot.

## 2024-03-11 NOTE — Telephone Encounter (Signed)
 Patient Advocate Encounter  Test billing for this patients current coverage (Rx [employer]) indicates a max of 30 day supply for Eliquis , with a $0 copay.  Rachel DEL, CPhT Rx Patient Advocate Phone: [phone]

## 2024-06-04 NOTE — Progress Notes (Unsigned)
 DVT Clinic Note  Name: Tristan Gray     MRN: [id number]     DOB: 08/23/1960     Sex: male  PCP: Bobbette Coye LABOR, MD  Today's Visit: Visit Information: Discharge Visit  Referred to DVT Clinic by: Orthopedic Surgery - Dr. Edna Referred to CPP by: Dr. Pearline Reason for referral:  Chief Complaint  Patient presents with   DVT   HISTORY OF PRESENT ILLNESS: Tristan Gray is a 63 y.o. male with PMH HTN, HLD, hepatic steatosis, generalized anxiety disorder, who presents for follow up medication management after diagnosis of DVT on 03/11/24. Patient is s/p right THA for femoral neck fracture 12/16/23. Then had left TKA on 03/06/24. Developed pain and swelling in the left leg immediately after surgery. Went to PT who was concerned for DVT. Ultrasound showed age indeterminate DVT in the left posterior tibial and peroneal veins. Last seen in DVT Clinic on 03/11/24 at which time he was started on Eliquis  with plan to treat for 3 months for first provoked DVT. Instructed him to stop taking aspirin  which he was taking for DVT prophylaxis post surgery. He had been elevating his leg and wearing compression stockings. Ambulating with use of a walker and accompanied by wife Damien. Denied prior history of DVT.   Today, patient reports he has been doing well. Denies abnormal bleeding or bruising. Endorses around 3 missed doses of Eliquis  over the past 3 months. Endorses wearing compression stockings. Only occasional pain in his calf. He denies swelling in his calf, but still has some swelling around his knee replacement. Ambulating with assistance from a cane. Overall feels like he has been back to himself and completing all his normal activities.   Positive Thrombotic Risk Factors: Recent surgery (within 3 months) Bleeding Risk Factors: None Present  Negative Thrombotic Risk Factors: Previous VTE, Recent trauma (within 3 months), Recent admission to hospital with acute illness (within 3 months), Paralysis,  paresis, or recent plaster cast immobilization of lower extremity, Bed rest >72 hours within 3 months, Sedentary journey lasting >8 hours within 4 weeks, Pregnancy, Central venous catheterization, Estrogen therapy, Recent cesarean section (within 3 months), Within 6 weeks postpartum, Testosterone therapy, Erythropoiesis-stimulating agent, Known thrombophilic condition, Recent COVID diagnosis (within 3 months), Smoking, Non-malignant, chronic inflammatory condition, Active cancer, Obesity, Older age  Rx Insurance Coverage: Commercial Rx Affordability: Eliquis  is $0 per month, limited to 30 day supply Rx Assistance Provided: None needed at this time Preferred Pharmacy: Starter pack previously filled at [facility]. [facility].   Past Medical History:  Diagnosis Date   History of kidney stones    Hypertension     Past Surgical History:  Procedure Laterality Date   LUMBAR DISC SURGERY  2008   TOTAL HIP ARTHROPLASTY Right 12/16/2023   Procedure: ARTHROPLASTY, HIP, TOTAL,POSTERIOR APPROACH;  Surgeon: Edna Toribio LABOR, MD;  Location: [facility];  Service: Orthopedics;  Laterality: Right;    Social History   Socioeconomic History   Marital status: Married    Spouse name: Not on file   Number of children: Not on file   Years of education: Not on file   Highest education level: Not on file  Occupational History   Not on file  Tobacco Use   Smoking status: Former   Smokeless tobacco: Never  Vaping Use   Vaping status: Never Used  Substance and Sexual Activity   Alcohol  use: Yes    Comment: socially   Drug use: Never  Sexual activity: Not on file  Other Topics Concern   Not on file  Social History Narrative   Not on file   Social Drivers of Health   Financial Resource Strain: Low Risk  (10/01/2023)   Received from [facility]   Overall Financial Resource Strain (CARDIA)    Difficulty of Paying Living Expenses: Not very hard  Food Insecurity: No Food  Insecurity (12/15/2023)   Hunger Vital Sign    Worried About Running Out of Food in the Last Year: Never true    Ran Out of Food in the Last Year: Never true  Transportation Needs: No Transportation Needs (12/15/2023)   PRAPARE - Administrator, Civil Service (Medical): No    Lack of Transportation (Non-Medical): No  Physical Activity: Insufficiently Active (10/01/2023)   Received from [facility]   Exercise Vital Sign    On average, how many days per week do you engage in moderate to strenuous exercise (like a brisk walk)?: 4 days    On average, how many minutes do you engage in exercise at this level?: 30 min  Stress: Stress Concern Present (10/01/2023)   Received from [facility]ational [facility] - Occupational Stress Questionnaire    Feeling of Stress : To some extent  Social Connections: Socially Integrated (10/01/2023)   Received from [facility]   Social Network    How would you rate your social network (family, work, friends)?: Good participation with social networks  Intimate Partner Violence: Not At Risk (12/15/2023)   Humiliation, Afraid, Rape, and Kick questionnaire    Fear of Current or Ex-Partner: No    Emotionally Abused: No    Physically Abused: No    Sexually Abused: No    Family History  Problem Relation Age of Onset   Pancreatic cancer Mother    Emphysema Sister     Allergies as of 06/05/2024 - Review Complete 06/05/2024  Allergen Reaction Noted   Prednisone Swelling 06/06/2017   Penicillins  04/19/2012    Current Outpatient Medications on File Prior to Visit  Medication Sig Dispense Refill   amLODipine  (NORVASC ) 10 MG tablet Take 10 mg by mouth at bedtime.      buPROPion  (WELLBUTRIN  XL) 150 MG 24 hr tablet Take 150 mg by mouth daily.      FLUoxetine  (PROZAC ) 10 MG capsule Take 10 mg by mouth daily.     lisinopril  (ZESTRIL ) 40 MG tablet Take 40 mg by mouth daily.     methocarbamol  (ROBAXIN ) 500 MG tablet Take 500 mg by  mouth every 6 (six) hours as needed.     Multiple Vitamin (MULTIVITAMIN PO) Take 1 tablet by mouth daily.     oxyCODONE -acetaminophen  (PERCOCET/ROXICET) 5-325 MG tablet Take 1 tablet by mouth daily as needed.     pravastatin  (PRAVACHOL ) 40 MG tablet Take 40 mg by mouth at bedtime.     zolpidem (AMBIEN) 5 MG tablet Take 5 mg by mouth at bedtime as needed. (Patient not taking: Reported on 06/05/2024)     No current facility-administered medications on file prior to visit.   REVIEW OF SYSTEMS:  Review of Systems  Respiratory:  Negative for shortness of breath.   Cardiovascular:  Negative for chest pain and leg swelling.  Musculoskeletal:  Negative for myalgias.   PHYSICAL EXAMINATION:  Vitals:   06/05/24 1443  BP: (!) 125/90  Pulse: 81  SpO2: 96%    There is no height or weight on file to calculate BMI.  Physical Exam Vitals reviewed.  Pulmonary:     Effort: Pulmonary effort is normal.  Musculoskeletal:        General: Tenderness present.     Left lower leg: No edema.  Neurological:     Mental Status: He is alert.   Villalta Score for Post-Thrombotic Syndrome: Pain: Absent Cramps: Absent Heaviness: Absent Paresthesia: Absent Pruritus: Absent Pretibial Edema: Absent Skin Induration: Absent Hyperpigmentation: Absent Redness: Absent Venous Ectasia: Absent Pain on calf compression: Mild Villalta Preliminary Score: 1 Is venous ulcer present?: No If venous ulcer is present and score is <15, then 15 points total are assigned: Absent Villalta Total Score: 1  LABS:  CBC     Component Value Date/Time   WBC 16.3 (H) 12/17/2023 0317   RBC 4.23 12/17/2023 0317   HGB 14.1 12/17/2023 0317   HCT 40.9 12/17/2023 0317   PLT 169 12/17/2023 0317   MCV 96.7 12/17/2023 0317   MCH 33.3 12/17/2023 0317   MCHC 34.5 12/17/2023 0317   RDW 12.8 12/17/2023 0317   LYMPHSABS 1.8 12/15/2023 1622   MONOABS 1.1 (H) 12/15/2023 1622   EOSABS 0.2 12/15/2023 1622   BASOSABS 0.1 12/15/2023  1622    Hepatic Function      Component Value Date/Time   PROT 6.4 (L) 12/17/2023 0317   ALBUMIN  3.3 (L) 12/17/2023 0317   AST 56 (H) 12/17/2023 0317   ALT 76 (H) 12/17/2023 0317   ALKPHOS 64 12/17/2023 0317   BILITOT 1.1 12/17/2023 0317    Renal Function   Lab Results  Component Value Date   CREATININE 0.76 12/17/2023   CREATININE 0.62 12/16/2023   CREATININE 0.74 12/15/2023    CrCl cannot be calculated (Patient's most recent lab result is older than the maximum 21 days allowed.).   VVS Vascular Lab Studies:  03/11/24 VAS US  LOWER EXTREMITY VENOUS LEFT (DVT)  Summary:  RIGHT:  - No evidence of common femoral vein obstruction.    LEFT:  - Findings consistent with age indeterminate deep vein thrombosis  involving the left posterior tibial veins, and left peroneal veins.    - No cystic structure found in the popliteal fossa.   ASSESSMENT: Location of DVT: Left distal vein Cause of DVT: provoked by a transient risk factor  Patient without prior history of DVT diagnosed with age-indeterminate DVT in the left posterior tibial and peroneal veins s/p left TKA on 03/06/24. He was started on anticoagulation with Eliquis  on 03/11/24 with plan to treat for 3 months for first provoked DVT. His left leg pain has improved and he has no swelling in his left calf. He has tolerated Eliquis  well and has about a week or less of medication left on hand at home. Instructed him to finish his current supply of Eliquis  then discontinue the medication as he will have completed 3 months of treatment. Counseled on DVT risk reduction strategies and to let all providers know of his DVT history. Patient confirmed understanding of plan.   PLAN: -Patient is discharged from the DVT Clinic. -Discontinue anticoagulation with Eliquis  after he finishes his current supply of medication, as patient has completed 3 months of treatment for provoked DVT.  -Counseled patient on future VTE risk reduction strategies and  to inform all future providers of DVT history.  Follow up: No further follow up in DVT clinic needed  Izetta Henry, PharmD Deep Vein Thrombosis Clinic Clinical Pharmacist [phone]

## 2024-06-05 ENCOUNTER — Ambulatory Visit: Attending: Vascular Surgery | Admitting: Pharmacist

## 2024-06-05 VITALS — BP 125/90 | HR 81

## 2024-06-05 DIAGNOSIS — I82452 Acute embolism and thrombosis of left peroneal vein: Secondary | ICD-10-CM | POA: Diagnosis not present

## 2024-06-05 NOTE — Patient Instructions (Signed)
-  Finish your current supply of Eliquis  then stop taking the medication If you have any questions or need to reschedule an appointment, please call [phone].
